# Patient Record
Sex: Female | Born: 1965
Health system: Southern US, Community
[De-identification: ages and names within clinical notes are randomized; demographics above are authoritative.]

## PROBLEM LIST (undated history)

## (undated) DIAGNOSIS — N840 Polyp of corpus uteri: Secondary | ICD-10-CM

## (undated) DIAGNOSIS — E079 Disorder of thyroid, unspecified: Secondary | ICD-10-CM

## (undated) DIAGNOSIS — D649 Anemia, unspecified: Secondary | ICD-10-CM

## (undated) DIAGNOSIS — E7800 Pure hypercholesterolemia, unspecified: Secondary | ICD-10-CM

## (undated) DIAGNOSIS — K219 Gastro-esophageal reflux disease without esophagitis: Secondary | ICD-10-CM

## (undated) DIAGNOSIS — G4730 Sleep apnea, unspecified: Secondary | ICD-10-CM

## (undated) DIAGNOSIS — F419 Anxiety disorder, unspecified: Secondary | ICD-10-CM

## (undated) DIAGNOSIS — F32A Depression, unspecified: Secondary | ICD-10-CM

## (undated) DIAGNOSIS — F429 Obsessive-compulsive disorder, unspecified: Secondary | ICD-10-CM

## (undated) DIAGNOSIS — I10 Essential (primary) hypertension: Secondary | ICD-10-CM

## (undated) DIAGNOSIS — D219 Benign neoplasm of connective and other soft tissue, unspecified: Secondary | ICD-10-CM

## (undated) DIAGNOSIS — E039 Hypothyroidism, unspecified: Secondary | ICD-10-CM

## (undated) DIAGNOSIS — C801 Malignant (primary) neoplasm, unspecified: Secondary | ICD-10-CM

## (undated) DIAGNOSIS — F329 Major depressive disorder, single episode, unspecified: Secondary | ICD-10-CM

## (undated) HISTORY — PX: DILATION AND CURETTAGE OF UTERUS: SHX78

## (undated) HISTORY — DX: Depression, unspecified: F32.A

## (undated) HISTORY — PX: DIAGNOSTIC LAPAROSCOPY: SUR761

## (undated) HISTORY — PX: WISDOM TOOTH EXTRACTION: SHX21

## (undated) HISTORY — DX: Benign neoplasm of connective and other soft tissue, unspecified: D21.9

## (undated) HISTORY — DX: Major depressive disorder, single episode, unspecified: F32.9

## (undated) HISTORY — PX: ABLATION: SHX5711

## (undated) HISTORY — DX: Polyp of corpus uteri: N84.0

---

## 2000-09-18 ENCOUNTER — Emergency Department (HOSPITAL_COMMUNITY): Admission: EM | Admit: 2000-09-18 | Discharge: 2000-09-18 | Payer: Self-pay | Admitting: Emergency Medicine

## 2001-06-01 ENCOUNTER — Ambulatory Visit (HOSPITAL_COMMUNITY): Admission: RE | Admit: 2001-06-01 | Discharge: 2001-06-01 | Payer: Self-pay | Admitting: Radiation Oncology

## 2001-06-01 ENCOUNTER — Encounter: Payer: Self-pay | Admitting: Family Medicine

## 2003-12-02 ENCOUNTER — Encounter: Admission: RE | Admit: 2003-12-02 | Discharge: 2003-12-02 | Payer: Self-pay | Admitting: Obstetrics and Gynecology

## 2004-05-18 ENCOUNTER — Emergency Department (HOSPITAL_COMMUNITY): Admission: EM | Admit: 2004-05-18 | Discharge: 2004-05-18 | Payer: Self-pay | Admitting: Emergency Medicine

## 2004-08-16 ENCOUNTER — Other Ambulatory Visit: Admission: RE | Admit: 2004-08-16 | Discharge: 2004-08-16 | Payer: Self-pay | Admitting: Family Medicine

## 2004-08-18 ENCOUNTER — Ambulatory Visit (HOSPITAL_COMMUNITY): Admission: RE | Admit: 2004-08-18 | Discharge: 2004-08-18 | Payer: Self-pay | Admitting: Family Medicine

## 2005-07-10 HISTORY — PX: MELANOMA EXCISION: SHX5266

## 2005-08-21 ENCOUNTER — Ambulatory Visit: Payer: Self-pay | Admitting: Psychiatry

## 2005-08-22 ENCOUNTER — Other Ambulatory Visit (HOSPITAL_COMMUNITY): Admission: RE | Admit: 2005-08-22 | Discharge: 2005-09-04 | Payer: Self-pay | Admitting: Psychiatry

## 2005-09-13 ENCOUNTER — Ambulatory Visit: Payer: Self-pay | Admitting: Licensed Clinical Social Worker

## 2005-09-19 ENCOUNTER — Encounter: Admission: RE | Admit: 2005-09-19 | Discharge: 2005-09-19 | Payer: Self-pay | Admitting: Obstetrics and Gynecology

## 2005-09-20 ENCOUNTER — Ambulatory Visit: Payer: Self-pay | Admitting: Licensed Clinical Social Worker

## 2005-09-25 ENCOUNTER — Ambulatory Visit: Payer: Self-pay | Admitting: Licensed Clinical Social Worker

## 2005-10-03 ENCOUNTER — Ambulatory Visit: Payer: Self-pay | Admitting: Licensed Clinical Social Worker

## 2005-10-10 ENCOUNTER — Ambulatory Visit: Payer: Self-pay | Admitting: Licensed Clinical Social Worker

## 2005-10-17 ENCOUNTER — Ambulatory Visit: Payer: Self-pay | Admitting: Licensed Clinical Social Worker

## 2005-10-26 ENCOUNTER — Ambulatory Visit: Payer: Self-pay | Admitting: Licensed Clinical Social Worker

## 2005-10-31 ENCOUNTER — Ambulatory Visit: Payer: Self-pay | Admitting: Licensed Clinical Social Worker

## 2005-11-07 ENCOUNTER — Ambulatory Visit: Payer: Self-pay | Admitting: Licensed Clinical Social Worker

## 2005-11-14 ENCOUNTER — Ambulatory Visit: Payer: Self-pay | Admitting: Licensed Clinical Social Worker

## 2005-12-12 ENCOUNTER — Ambulatory Visit: Payer: Self-pay | Admitting: Licensed Clinical Social Worker

## 2005-12-20 ENCOUNTER — Ambulatory Visit: Payer: Self-pay | Admitting: Licensed Clinical Social Worker

## 2006-01-03 ENCOUNTER — Ambulatory Visit: Payer: Self-pay | Admitting: Licensed Clinical Social Worker

## 2006-01-07 DIAGNOSIS — C801 Malignant (primary) neoplasm, unspecified: Secondary | ICD-10-CM

## 2006-01-07 HISTORY — DX: Malignant (primary) neoplasm, unspecified: C80.1

## 2006-01-09 ENCOUNTER — Encounter (INDEPENDENT_AMBULATORY_CARE_PROVIDER_SITE_OTHER): Payer: Self-pay | Admitting: Specialist

## 2006-01-09 ENCOUNTER — Ambulatory Visit (HOSPITAL_COMMUNITY): Admission: RE | Admit: 2006-01-09 | Discharge: 2006-01-09 | Payer: Self-pay | Admitting: *Deleted

## 2006-01-31 ENCOUNTER — Encounter: Admission: RE | Admit: 2006-01-31 | Discharge: 2006-01-31 | Payer: Self-pay | Admitting: Obstetrics and Gynecology

## 2006-03-01 ENCOUNTER — Ambulatory Visit: Payer: Self-pay | Admitting: Oncology

## 2006-03-27 ENCOUNTER — Ambulatory Visit (HOSPITAL_COMMUNITY): Admission: RE | Admit: 2006-03-27 | Discharge: 2006-03-27 | Payer: Self-pay | Admitting: Dermatology

## 2007-05-21 ENCOUNTER — Encounter: Admission: RE | Admit: 2007-05-21 | Discharge: 2007-05-21 | Payer: Self-pay | Admitting: Neurology

## 2007-11-25 ENCOUNTER — Other Ambulatory Visit: Admission: RE | Admit: 2007-11-25 | Discharge: 2007-11-25 | Payer: Self-pay | Admitting: Obstetrics and Gynecology

## 2007-12-06 ENCOUNTER — Encounter: Admission: RE | Admit: 2007-12-06 | Discharge: 2007-12-06 | Payer: Self-pay | Admitting: Obstetrics and Gynecology

## 2008-01-15 ENCOUNTER — Emergency Department (HOSPITAL_COMMUNITY): Admission: EM | Admit: 2008-01-15 | Discharge: 2008-01-15 | Payer: Self-pay | Admitting: Emergency Medicine

## 2010-02-09 ENCOUNTER — Ambulatory Visit: Payer: Self-pay | Admitting: Hematology and Oncology

## 2010-02-10 LAB — CBC & DIFF AND RETIC
Immature Retic Fract: 17.8 % — ABNORMAL HIGH (ref 0.00–10.70)
MCH: 22 pg — ABNORMAL LOW (ref 25.1–34.0)
MCHC: 30.3 g/dL — ABNORMAL LOW (ref 31.5–36.0)
MONO#: 0.5 10*3/uL (ref 0.1–0.9)
NEUT%: 59.5 % (ref 38.4–76.8)
Platelets: 354 10*3/uL (ref 145–400)
RBC: 4.78 10*6/uL (ref 3.70–5.45)
Retic %: 1.84 % — ABNORMAL HIGH (ref 0.50–1.50)
Retic Ct Abs: 87.95 10*3/uL — ABNORMAL HIGH (ref 18.30–72.70)
nRBC: 0 % (ref 0–0)

## 2010-02-10 LAB — URINALYSIS, MICROSCOPIC - CHCC
Bilirubin (Urine): NEGATIVE
Leukocyte Esterase: NEGATIVE
Nitrite: NEGATIVE
Protein: NEGATIVE mg/dL
Specific Gravity, Urine: 1.015 (ref 1.003–1.035)
pH: 5 (ref 4.6–8.0)

## 2010-02-10 LAB — MORPHOLOGY

## 2010-02-15 LAB — COMPREHENSIVE METABOLIC PANEL
AST: 17 U/L (ref 0–37)
Albumin: 4.1 g/dL (ref 3.5–5.2)
Alkaline Phosphatase: 62 U/L (ref 39–117)
CO2: 26 mEq/L (ref 19–32)
Chloride: 102 mEq/L (ref 96–112)
Creatinine, Ser: 0.78 mg/dL (ref 0.40–1.20)
Glucose, Bld: 95 mg/dL (ref 70–99)
Potassium: 3.7 mEq/L (ref 3.5–5.3)
Sodium: 137 mEq/L (ref 135–145)
Total Bilirubin: 0.2 mg/dL — ABNORMAL LOW (ref 0.3–1.2)
Total Protein: 7.2 g/dL (ref 6.0–8.3)

## 2010-02-15 LAB — PROTEIN ELECTROPHORESIS, SERUM
Alpha-1-Globulin: 7.2 % — ABNORMAL HIGH (ref 2.9–4.9)
Alpha-2-Globulin: 9.7 % (ref 7.1–11.8)
Beta 2: 4 % (ref 3.2–6.5)
Beta Globulin: 7.1 % (ref 4.7–7.2)

## 2010-02-15 LAB — VON WILLEBRAND PANEL
Factor-VIII Activity: 114 % (ref 50–150)
Ristocetin-Cofactor: 134 % (ref 50–150)
Von Willebrand Ag: 123 % normal (ref 61–164)

## 2010-02-15 LAB — DIRECT ANTIGLOBULIN TEST (NOT AT ARMC): DAT IgG: NEGATIVE

## 2010-02-15 LAB — IRON AND TIBC: %SAT: 7 % — ABNORMAL LOW (ref 20–55)

## 2010-02-15 LAB — HAPTOGLOBIN: Haptoglobin: 150 mg/dL (ref 16–200)

## 2010-03-28 ENCOUNTER — Ambulatory Visit: Payer: Self-pay | Admitting: Hematology and Oncology

## 2010-03-30 LAB — CBC WITH DIFFERENTIAL/PLATELET
BASO%: 0.3 % (ref 0.0–2.0)
EOS%: 2.5 % (ref 0.0–7.0)
HGB: 13.8 g/dL (ref 11.6–15.9)
MCHC: 33.7 g/dL (ref 31.5–36.0)
MONO%: 5.7 % (ref 0.0–14.0)
Platelets: 269 10*3/uL (ref 145–400)
RBC: 5.09 10*6/uL (ref 3.70–5.45)
RDW: 26.1 % — ABNORMAL HIGH (ref 11.2–14.5)
WBC: 8.5 10*3/uL (ref 3.9–10.3)
lymph#: 1.8 10*3/uL (ref 0.9–3.3)

## 2010-03-30 LAB — BASIC METABOLIC PANEL
BUN: 10 mg/dL (ref 6–23)
CO2: 26 mEq/L (ref 19–32)
Calcium: 9.6 mg/dL (ref 8.4–10.5)
Creatinine, Ser: 0.79 mg/dL (ref 0.40–1.20)
Sodium: 137 mEq/L (ref 135–145)

## 2010-05-23 ENCOUNTER — Emergency Department (HOSPITAL_COMMUNITY): Admission: EM | Admit: 2010-05-23 | Discharge: 2010-05-23 | Payer: Self-pay | Admitting: Emergency Medicine

## 2010-05-28 ENCOUNTER — Emergency Department (HOSPITAL_COMMUNITY): Admission: EM | Admit: 2010-05-28 | Discharge: 2010-05-29 | Payer: Self-pay | Admitting: Emergency Medicine

## 2010-06-01 ENCOUNTER — Other Ambulatory Visit (HOSPITAL_COMMUNITY)
Admission: RE | Admit: 2010-06-01 | Discharge: 2010-06-08 | Payer: Self-pay | Source: Home / Self Care | Admitting: Psychiatry

## 2010-06-10 ENCOUNTER — Ambulatory Visit: Payer: Self-pay | Admitting: Psychiatry

## 2010-07-22 ENCOUNTER — Ambulatory Visit (HOSPITAL_COMMUNITY)
Admission: RE | Admit: 2010-07-22 | Discharge: 2010-07-22 | Payer: Self-pay | Source: Home / Self Care | Attending: Psychiatry | Admitting: Psychiatry

## 2010-09-20 LAB — BASIC METABOLIC PANEL
BUN: 5 mg/dL — ABNORMAL LOW (ref 6–23)
Calcium: 8.9 mg/dL (ref 8.4–10.5)
Creatinine, Ser: 0.7 mg/dL (ref 0.4–1.2)
GFR calc non Af Amer: >60 mL/min (ref >60)

## 2010-09-20 LAB — CBC
HCT: 37.4 % (ref 36.0–46.0)
MCH: 28.1 pg (ref 26.0–34.0)
RBC: 4.48 MIL/uL (ref 3.87–5.11)
WBC: 7.5 10*3/uL (ref 4.0–10.5)

## 2010-09-20 LAB — DIFFERENTIAL
Basophils Absolute: 0 10*3/uL (ref 0.0–0.1)
Eosinophils Relative: 2 % (ref 0–5)
Lymphocytes Relative: 25 % (ref 12–46)
Monocytes Relative: 8 % (ref 3–12)
Neutrophils Relative %: 66 % (ref 43–77)

## 2010-09-20 LAB — HCG, QUANTITATIVE, PREGNANCY: hCG, Beta Chain, Quant, S: <2 m[IU]/mL (ref <5)

## 2010-09-20 LAB — WET PREP, GENITAL: Trich, Wet Prep: NONE SEEN

## 2010-09-20 LAB — GC/CHLAMYDIA PROBE AMP, GENITAL
Chlamydia, DNA Probe: NEGATIVE
GC Probe Amp, Genital: NEGATIVE

## 2010-09-29 ENCOUNTER — Other Ambulatory Visit: Payer: Self-pay | Admitting: Dermatology

## 2010-09-29 ENCOUNTER — Ambulatory Visit (HOSPITAL_COMMUNITY)
Admission: RE | Admit: 2010-09-29 | Discharge: 2010-09-29 | Disposition: A | Discharge status: Home / Self Care | Payer: 59 | Source: Ambulatory Visit | Attending: Dermatology | Admitting: Dermatology

## 2010-09-29 ENCOUNTER — Other Ambulatory Visit (HOSPITAL_COMMUNITY): Payer: Self-pay | Admitting: Dermatology

## 2010-09-29 DIAGNOSIS — R0602 Shortness of breath: Secondary | ICD-10-CM | POA: Insufficient documentation

## 2010-09-29 DIAGNOSIS — F17200 Nicotine dependence, unspecified, uncomplicated: Secondary | ICD-10-CM | POA: Insufficient documentation

## 2010-09-29 DIAGNOSIS — R059 Cough, unspecified: Secondary | ICD-10-CM | POA: Insufficient documentation

## 2010-09-29 DIAGNOSIS — Z85828 Personal history of other malignant neoplasm of skin: Secondary | ICD-10-CM | POA: Insufficient documentation

## 2010-09-29 DIAGNOSIS — I10 Essential (primary) hypertension: Secondary | ICD-10-CM | POA: Insufficient documentation

## 2010-09-29 DIAGNOSIS — R05 Cough: Secondary | ICD-10-CM | POA: Insufficient documentation

## 2010-11-25 NOTE — Op Note (Signed)
Adrienne Freeman, Adrienne Freeman                ACCOUNT NO.:  [id number]   MEDICAL RECORD NO.:  [id number]          PATIENT TYPE:  AMB   LOCATION:  SDS                          FACILITY:  [facility]   PHYSICIAN:  Alfonse Ras, MD   DATE OF BIRTH:  07-05-1966   DATE OF PROCEDURE:  01/09/2006  DATE OF DISCHARGE:  01/09/2006                                 OPERATIVE REPORT   NOTE:  This is a repeat dictation.   PREOPERATIVE DIAGNOSIS:  Malignant melanoma of the left lower extremity.   POSTOPERATIVE DIAGNOSIS:  Malignant melanoma of the left lower extremity.   PROCEDURE:  Wide excision of left lower extremity melanoma.   DESCRIPTION:  Patient was taken to the operating room and placed in a supine  position.  After adequate anesthesia was induced using   DICTATION ENDED AT THIS POINT.      Alfonse Ras, MD  Electronically Signed     KRE/MEDQ  D:  04/16/2006  T:  04/17/2006  Job:  [id number]

## 2010-11-25 NOTE — Op Note (Signed)
Adrienne Freeman, Adrienne Freeman                ACCOUNT NO.:  [id number]   MEDICAL RECORD NO.:  [id number]          PATIENT TYPE:  AMB   LOCATION:  SDS                          FACILITY:  [facility]   PHYSICIAN:  Alfonse Ras, MD   DATE OF BIRTH:  1965-11-26   DATE OF PROCEDURE:  01/09/2006  DATE OF DISCHARGE:                                 OPERATIVE REPORT   PREOPERATIVE DIAGNOSIS:  Left lower extremity malignant melanoma.   POSTOPERATIVE DIAGNOSIS:  Left lower extremity malignant melanoma.   PROCEDURE:  Wide excision of left extremity melanoma.   ANESTHESIA:  Local MAC.   SURGEON:  Baruch Merl, M.D.   DESCRIPTION:  The patient was taken to operating room and placed in the  right lateral decubitus position.  After adequate MAC anesthesia was  induced, the left posterior leg was prepped and draped in normal sterile  fashion.  Using 1% lidocaine with bicarb as local anesthesia, skin was  anesthetized.  An elliptical incision was made around the previous puncture  biopsy site out to about 9 to 10 mm in all directions.  This was taken down  to the subcutaneous fat and excised in its entirety.  Distal end was marked  with a nylon suture.  The small flaps were created in both directions until  primary closure could be accomplished with vertical mattress 3-0 nylon  sutures.  Bacitracin ointment was placed.  The patient tolerated the  procedure well with PACU in good condition.      Alfonse Ras, MD  Electronically Signed     KRE/MEDQ  D:  01/09/2006  T:  01/09/2006  Job:  [id number]

## 2011-01-30 HISTORY — PX: DOPPLER ECHOCARDIOGRAPHY: SHX263

## 2011-06-22 ENCOUNTER — Inpatient Hospital Stay (HOSPITAL_COMMUNITY)
Admission: AD | Admit: 2011-06-22 | Discharge: 2011-06-23 | Disposition: A | Discharge status: Home / Self Care | Payer: BC Managed Care – PPO | Source: Ambulatory Visit | Attending: Obstetrics and Gynecology | Admitting: Obstetrics and Gynecology

## 2011-06-22 ENCOUNTER — Encounter (HOSPITAL_COMMUNITY): Payer: Self-pay | Admitting: *Deleted

## 2011-06-22 DIAGNOSIS — D649 Anemia, unspecified: Secondary | ICD-10-CM | POA: Insufficient documentation

## 2011-06-22 DIAGNOSIS — N920 Excessive and frequent menstruation with regular cycle: Secondary | ICD-10-CM | POA: Insufficient documentation

## 2011-06-22 DIAGNOSIS — D259 Leiomyoma of uterus, unspecified: Secondary | ICD-10-CM | POA: Insufficient documentation

## 2011-06-22 DIAGNOSIS — D219 Benign neoplasm of connective and other soft tissue, unspecified: Secondary | ICD-10-CM

## 2011-06-22 HISTORY — DX: Disorder of thyroid, unspecified: E07.9

## 2011-06-22 HISTORY — DX: Essential (primary) hypertension: I10

## 2011-06-22 HISTORY — DX: Anemia, unspecified: D64.9

## 2011-06-22 LAB — CBC
HCT: 30.9 % — ABNORMAL LOW (ref 36.0–46.0)
MCHC: 30.7 g/dL (ref 30.0–36.0)
MCV: 73 fL — ABNORMAL LOW (ref 78.0–100.0)
RDW: 19.2 % — ABNORMAL HIGH (ref 11.5–15.5)

## 2011-06-22 LAB — TYPE AND SCREEN
ABO/RH(D): A POS
Antibody Screen: NEGATIVE

## 2011-06-22 NOTE — Progress Notes (Signed)
Pt states she has been having heavy bleeding since last night.

## 2011-06-22 NOTE — Progress Notes (Signed)
PT SAYS CYCLE STARTED ON WED. Marland Kitchen   SAYS HAS IRON DR- GAVE IRON  IN 2011.  LABS DONE IN OFFICE - DR WHITE-  ON 06-09-2011-    TAKES NEW IRON.     SAYS CYCLE WAS HEAVY   WED NIGHT.- TODAY- 2X HEAVY - SINCE HERE IS LESS.   PAD ON  IN TRIAGE-SMALL AMT

## 2011-06-23 MED ORDER — TRANEXAMIC ACID 650 MG PO TABS: 1300.0000 mg | ORAL_TABLET | Freq: Three times a day (TID) | ORAL | Status: DC

## 2011-06-23 NOTE — ED Provider Notes (Signed)
History   Adrienne Freeman is a 45 y.o. year old G20P3003 female who presents to [facility]U reporting heavy menstrual bleeding since yesterday, slightly lighter today. She denies dizziness. She was referred to [facility]U by her Dr.'s office due to Hx of severe anemia. She has been Dx w/ fibroids and has had an ablation that did not work.    CSN: [id number] Arrival date & time: 06/22/2011  6:26 PM   None     No chief complaint on file.   (Consider location/radiation/quality/duration/timing/severity/associated sxs/prior treatment) HPI  Past Medical History  Diagnosis Date  . Hypertension   . Anemia   . Thyroid disorder     Past Surgical History  Procedure Date  . Albllation     Family History  Problem Relation Age of Onset  . Hypertension Mother   . Heart disease Father   . Hypertension Father     History  Substance Use Topics  . Smoking status: Former Games developer  . Smokeless tobacco: Not on file  . Alcohol Use: No    OB History    Grav Para Term Preterm Abortions TAB SAB Ect Mult Living   3 3 3       3       Review of Systems: Otherwise neg  Allergies  Lexapro; Toprol xl; Altace; Codeine; Effexor; Paxil; and Zoloft  Home Medications  No current outpatient prescriptions on file.  BP 142/87  Pulse 74  Temp(Src) 98.3 F (36.8 C) (Oral)  Resp 18  Ht 5' (1.524 m)  Wt 99.848 kg (220 lb 2 oz)  BMI 42.99 kg/m2  LMP 06/21/2011  Physical Exam  Constitutional: She appears well-developed.  A&Ox4, anxious Mod amount of DRB in vault. No CMT, polyps or adnexal masses.  ED Course  Procedures (including critical care time)  Recent Results (from the past 336 hour(s))  CBC   Collection Time   06/22/11 10:45 PM      Component Value Range   WBC 10.4  4.0 - 10.5 (K/uL)   RBC 4.23  3.87 - 5.11 (MIL/uL)   Hemoglobin 9.5 (*) 12.0 - 15.0 (g/dL)   HCT 40.9 (*) 81.1 - 46.0 (%)   MCV 73.0 (*) 78.0 - 100.0 (fL)   MCH 22.5 (*) 26.0 - 34.0 (pg)   MCHC 30.7  30.0 - 36.0 (g/dL)   RDW  91.4 (*) 78.2 - 15.5 (%)   Platelets 340  150 - 400 (K/uL)  TYPE AND SCREEN   Collection Time   06/22/11 10:45 PM      Component Value Range   ABO/RH(D) A POS     Antibody Screen NEG     Sample Expiration 06/25/2011    ABO/RH   Collection Time   06/22/11 10:45 PM      Component Value Range   ABO/RH(D) A POS      MDM  Menorrhagia due to fibroids. Mild anemia, hemodynamically stable  D/C home per consult w/ Dr. Gunnar Bulla. Rx Lysteda F/U w/ Gynecologist next week or MAU as needed for heavy bleeding  Adrienne Freeman 06/23/11 12:35 AM

## 2011-06-30 ENCOUNTER — Ambulatory Visit (HOSPITAL_COMMUNITY)
Admission: RE | Admit: 2011-06-30 | Discharge: 2011-06-30 | Disposition: A | Discharge status: Home / Self Care | Payer: BC Managed Care – PPO | Source: Ambulatory Visit | Attending: Psychiatry | Admitting: Psychiatry

## 2011-06-30 ENCOUNTER — Encounter (HOSPITAL_COMMUNITY): Payer: Self-pay | Admitting: *Deleted

## 2011-06-30 DIAGNOSIS — F411 Generalized anxiety disorder: Secondary | ICD-10-CM | POA: Insufficient documentation

## 2011-06-30 DIAGNOSIS — F331 Major depressive disorder, recurrent, moderate: Secondary | ICD-10-CM | POA: Insufficient documentation

## 2011-06-30 HISTORY — DX: Pure hypercholesterolemia, unspecified: E78.00

## 2011-06-30 HISTORY — DX: Gastro-esophageal reflux disease without esophagitis: K21.9

## 2011-06-30 HISTORY — DX: Hypothyroidism, unspecified: E03.9

## 2011-06-30 HISTORY — DX: Sleep apnea, unspecified: G47.30
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
## 2011-07-13 ENCOUNTER — Other Ambulatory Visit: Payer: Self-pay | Admitting: Nurse Practitioner

## 2011-07-13 ENCOUNTER — Telehealth: Payer: Self-pay | Admitting: Hematology and Oncology

## 2011-07-13 DIAGNOSIS — D649 Anemia, unspecified: Secondary | ICD-10-CM

## 2011-07-13 NOTE — Telephone Encounter (Signed)
lmonvm for pt re appts for 1/14 and 1/15

## 2011-07-17 ENCOUNTER — Other Ambulatory Visit: Payer: Self-pay | Admitting: Hematology and Oncology

## 2011-07-17 ENCOUNTER — Telehealth: Payer: Self-pay | Admitting: Hematology and Oncology

## 2011-07-17 DIAGNOSIS — C189 Malignant neoplasm of colon, unspecified: Secondary | ICD-10-CM

## 2011-07-17 NOTE — Telephone Encounter (Signed)
l/m that her appt was moved to 1/11 at 1:00 per dr Ria Clock

## 2011-07-18 ENCOUNTER — Telehealth: Payer: Self-pay | Admitting: *Deleted

## 2011-07-18 NOTE — Telephone Encounter (Signed)
Opened in error

## 2011-07-19 ENCOUNTER — Encounter: Payer: Self-pay | Admitting: *Deleted

## 2011-07-21 ENCOUNTER — Other Ambulatory Visit: Payer: Self-pay | Admitting: Hematology and Oncology

## 2011-07-21 ENCOUNTER — Other Ambulatory Visit (HOSPITAL_BASED_OUTPATIENT_CLINIC_OR_DEPARTMENT_OTHER): Payer: BC Managed Care – PPO

## 2011-07-21 ENCOUNTER — Telehealth: Payer: Self-pay | Admitting: Hematology and Oncology

## 2011-07-21 ENCOUNTER — Ambulatory Visit (HOSPITAL_BASED_OUTPATIENT_CLINIC_OR_DEPARTMENT_OTHER): Payer: BC Managed Care – PPO | Admitting: Hematology and Oncology

## 2011-07-21 VITALS — BP 134/70 | HR 82 | Temp 98.0°F | Ht 60.0 in | Wt 222.3 lb

## 2011-07-21 DIAGNOSIS — D539 Nutritional anemia, unspecified: Secondary | ICD-10-CM

## 2011-07-21 DIAGNOSIS — D649 Anemia, unspecified: Secondary | ICD-10-CM

## 2011-07-21 DIAGNOSIS — C189 Malignant neoplasm of colon, unspecified: Secondary | ICD-10-CM

## 2011-07-21 DIAGNOSIS — D509 Iron deficiency anemia, unspecified: Secondary | ICD-10-CM

## 2011-07-21 LAB — CBC WITH DIFFERENTIAL/PLATELET
Basophils Absolute: 0 10*3/uL (ref 0.0–0.1)
Eosinophils Absolute: 0.2 10*3/uL (ref 0.0–0.5)
HCT: 30.7 % — ABNORMAL LOW (ref 34.8–46.6)
HGB: 10 g/dL — ABNORMAL LOW (ref 11.6–15.9)
MONO#: 0.5 10*3/uL (ref 0.1–0.9)
NEUT%: 60 % (ref 38.4–76.8)
WBC: 7.4 10*3/uL (ref 3.9–10.3)
lymph#: 2.3 10*3/uL (ref 0.9–3.3)

## 2011-07-21 NOTE — Progress Notes (Signed)
Addended by: Normajean Baxter LE on: 07/21/2011 05:49 PM   Modules accepted: Orders

## 2011-07-21 NOTE — Progress Notes (Signed)
Dr.      Esmond Harps      -       Primary   @  [place] on  [address]      Fermin Schwab      -     Surgeon  @  [facility]   Dept  OB/GYN.   Cell    Phone       [phone].

## 2011-07-21 NOTE — Progress Notes (Signed)
This office note has been dictated.

## 2011-07-21 NOTE — Telephone Encounter (Signed)
had printed 2/14 and 2/19 appts for pt while in the office but called pt wiht 1/15 iron tx time,aware      aom

## 2011-07-22 NOTE — Progress Notes (Signed)
CC:   Adrienne Freeman. Adrienne Freeman, M.D.  IDENTIFYING PATIENT:  Patient is a 46 year old woman with anemia who presents for followup.  INTERVAL HISTORY:  The patient was last seen in July 2011.  In August 2011, she had presented with iron-deficiency anemia secondary to menorrhagia.  She is known to have uterine fibroids.  She has had an evaluation in the past with Dr. Candice Camp and had undergone ablation for uterine polyps.  The patient reports that after this procedure, she noted recurrence of her heavy menses.  She is currently on danazol which helps some.  She saw her gynecologist last month, and he had obtained iron studies that had noted a ferritin level of 8.  Hemoglobin and hematocrit at the time were 10.5 and 33.5, respectively.  Adrienne Freeman has received IV iron at this on August 2011, which she tolerated well with good results.  She also was extensively evaluated, and there was no evidence of hemolysis.  Her von Willebrand panel at that time was unremarkable.  MEDICATIONS:  Reviewed and updated.  ALLERGIES:  [place]per[place], Fortaz, Altace, Toprol, Lexapro, Zoloft, Effexor, Codeine, and Paxil.  SOCIAL HISTORY:  She is married with 3 children.  She denies alcohol or tobacco use.  She works as a Comptroller 2 days a week.  PAST MEDICAL HISTORY: 1. Status post excision of T1a melanoma from her calf in 2007. 2. History of uterine fibroids with menorrhagia. 3. Hypertension. 4. History of chronic lymphocytic thyroiditis. 5. Hypothyroidism. 6. Iron-deficiency anemia. 7. Esophageal reflux.  FAMILY HISTORY:  Negative for oncologic or hematologic malignancies.  REVIEW OF SYSTEMS:  Denies fatigue, shortness of breath, or cough. Denies fever chills, night sweats, or anorexia.  Cardiovascular: Denies chest pain, PND, orthopnea, or ankle swelling.  Respirations: Denies cough, hemoptysis, wheeze, or shortness of breath.  GI: Denies nausea, vomiting, abdominal pain, diarrhea, melena, or hematochezia.  GU:  Denies dysuria, hematuria, nocturia, or frequency.  Skin: No bruising or bleeding.  Neurologic: Denies headache, vision changes, or extremity weakness.  PHYSICAL EXAM:  General: The patient is a well-appearing, well- nourished, man in no distress.  Vitals: Pulse 82, blood pressure 134/70, temperature 98, and respirations 20.  HEENT: Head is atraumatic, normocephalic.  Sclerae anicteric.  Mouth moist.  Chest:  Clear.  CVS: Unremarkable.  Abdomen:  Soft, nontender.  No palpable masses.  Bowel sounds present.  Extremities: No calf tenderness.  Pulses present symmetrical.  Lymph nodes: No adenopathy.  CNS: Nonfocal.  LAB DATA:  CBC 07/21/2011:  White cell count 7.4, hemoglobin 10, hematocrit 30.7, and platelets 387.  Ferritin pending.  IMPRESSION AND PLAN:  Ms. Freeman is a 45 year old woman with a known history of iron-deficiency anemia secondary to menorrhagia.  She received IV iron in the form of INFeD in August 2011.  She is now significantly iron deficient.  She has agreed to proceed with iron.  She plans to follow up with a gynecologist from [facility] to be considered for a myomectomy.  She has been scheduled to follow up in 4-6 weeks' time.    ______________________________ Adrienne Freeman, M.D. LIO/MEDQ  D:  07/21/2011  T:  07/22/2011  Job:  [id number]

## 2011-07-24 ENCOUNTER — Other Ambulatory Visit: Payer: Self-pay | Admitting: Lab

## 2011-07-25 ENCOUNTER — Ambulatory Visit (HOSPITAL_BASED_OUTPATIENT_CLINIC_OR_DEPARTMENT_OTHER): Payer: BC Managed Care – PPO

## 2011-07-25 ENCOUNTER — Ambulatory Visit: Payer: Self-pay | Admitting: Hematology and Oncology

## 2011-07-25 VITALS — BP 131/84 | HR 62 | Temp 97.7°F

## 2011-07-25 DIAGNOSIS — N920 Excessive and frequent menstruation with regular cycle: Secondary | ICD-10-CM

## 2011-07-25 DIAGNOSIS — D539 Nutritional anemia, unspecified: Secondary | ICD-10-CM

## 2011-07-25 DIAGNOSIS — D500 Iron deficiency anemia secondary to blood loss (chronic): Secondary | ICD-10-CM

## 2011-07-25 LAB — COMPREHENSIVE METABOLIC PANEL
ALT: 24 U/L (ref 0–35)
BUN: 5 mg/dL — ABNORMAL LOW (ref 6–23)
CO2: 27 mEq/L (ref 19–32)
Calcium: 8.8 mg/dL (ref 8.4–10.5)
Chloride: 104 mEq/L (ref 96–112)
Creatinine, Ser: 0.78 mg/dL (ref 0.50–1.10)

## 2011-07-25 LAB — TRANSFERRIN RECEPTOR, SOLUABLE: Transferrin Receptor, Soluble: 2.11 mg/L — ABNORMAL HIGH (ref 0.76–1.76)

## 2011-07-25 MED ORDER — SODIUM CHLORIDE 0.9 % IV SOLN
Freq: Once | INTRAVENOUS | Status: AC
  Administered 2011-07-25: 10:00:00 via INTRAVENOUS

## 2011-07-25 MED ORDER — ACETAMINOPHEN 325 MG PO TABS
650.0000 mg | ORAL_TABLET | Freq: Once | ORAL | Status: AC
  Administered 2011-07-25: 650 mg via ORAL

## 2011-07-25 MED ORDER — DIPHENHYDRAMINE HCL 25 MG PO TABS
25.0000 mg | ORAL_TABLET | Freq: Once | ORAL | Status: AC
  Administered 2011-07-25: 25 mg via ORAL
  Filled 2011-07-25: qty 1

## 2011-07-25 MED ORDER — SODIUM CHLORIDE 0.9 % IV SOLN
25.0000 mg | Freq: Once | INTRAVENOUS | Status: AC
  Administered 2011-07-25: 25 mg via INTRAVENOUS
  Filled 2011-07-25: qty 0.5

## 2011-07-25 MED ORDER — SODIUM CHLORIDE 0.9 % IV SOLN
1723.0000 mg | Freq: Once | INTRAVENOUS | Status: AC
  Administered 2011-07-25: 1723 mg via INTRAVENOUS
  Filled 2011-07-25: qty 34.46

## 2011-07-25 NOTE — Progress Notes (Signed)
1345-Pt tolerating Infed without difficulties.  VS stable.  Rate increased to 118 ml/hr.  1445-Pt continues to tolerate Infed without difficulty or complaints.  VS stable.  Rate increased to 140 ml/hr.

## 2011-07-26 ENCOUNTER — Other Ambulatory Visit: Payer: Self-pay | Admitting: Oncology

## 2011-08-22 ENCOUNTER — Telehealth: Payer: Self-pay | Admitting: *Deleted

## 2011-08-22 NOTE — Telephone Encounter (Signed)
Pt called to inform md that she will have myomectomy surgery on  08/29/11.     Pt also is scheduled to f/u with md on same day.    Pt would like to know if pt should come in to see Dr. Dalene Carrow  Sooner prior to surgery.    Pt stated she had labs drawn at her primary Dr. Cliffton Asters.   Pt would like to know of Dr. Lonell Face  Recommendations. Pt's   Phone   [phone].

## 2011-08-22 NOTE — Telephone Encounter (Signed)
Dr. Dalene Carrow reviewed lab results from Dr. Lucilla Lame office.   Spoke with pt and informed pt re:   Hgb and Ferritin levels adequate;  Move forward with myomectomy surgery  As per md's instructions.    Instructed pt to call office after surgery so pt can be rescheduled for f/u appt with Dr. Dalene Carrow.    Pt voiced understanding.

## 2011-08-24 ENCOUNTER — Other Ambulatory Visit: Payer: Self-pay | Admitting: Lab

## 2011-08-29 ENCOUNTER — Ambulatory Visit: Payer: Self-pay | Admitting: Hematology and Oncology

## 2011-12-19 ENCOUNTER — Emergency Department (HOSPITAL_COMMUNITY)
Admission: EM | Admit: 2011-12-19 | Discharge: 2011-12-19 | Disposition: A | Discharge status: Home / Self Care | Payer: BC Managed Care – PPO | Attending: Emergency Medicine | Admitting: Emergency Medicine

## 2011-12-19 ENCOUNTER — Encounter (HOSPITAL_COMMUNITY): Payer: Self-pay | Admitting: Emergency Medicine

## 2011-12-19 ENCOUNTER — Emergency Department (HOSPITAL_COMMUNITY): Payer: BC Managed Care – PPO

## 2011-12-19 DIAGNOSIS — G4730 Sleep apnea, unspecified: Secondary | ICD-10-CM | POA: Insufficient documentation

## 2011-12-19 DIAGNOSIS — I10 Essential (primary) hypertension: Secondary | ICD-10-CM | POA: Insufficient documentation

## 2011-12-19 DIAGNOSIS — F411 Generalized anxiety disorder: Secondary | ICD-10-CM | POA: Insufficient documentation

## 2011-12-19 DIAGNOSIS — F419 Anxiety disorder, unspecified: Secondary | ICD-10-CM

## 2011-12-19 DIAGNOSIS — E7800 Pure hypercholesterolemia, unspecified: Secondary | ICD-10-CM | POA: Insufficient documentation

## 2011-12-19 DIAGNOSIS — R079 Chest pain, unspecified: Secondary | ICD-10-CM

## 2011-12-19 DIAGNOSIS — D259 Leiomyoma of uterus, unspecified: Secondary | ICD-10-CM | POA: Insufficient documentation

## 2011-12-19 DIAGNOSIS — Z85828 Personal history of other malignant neoplasm of skin: Secondary | ICD-10-CM | POA: Insufficient documentation

## 2011-12-19 DIAGNOSIS — E039 Hypothyroidism, unspecified: Secondary | ICD-10-CM | POA: Insufficient documentation

## 2011-12-19 DIAGNOSIS — K219 Gastro-esophageal reflux disease without esophagitis: Secondary | ICD-10-CM | POA: Insufficient documentation

## 2011-12-19 DIAGNOSIS — E063 Autoimmune thyroiditis: Secondary | ICD-10-CM | POA: Insufficient documentation

## 2011-12-19 DIAGNOSIS — Z79899 Other long term (current) drug therapy: Secondary | ICD-10-CM | POA: Insufficient documentation

## 2011-12-19 DIAGNOSIS — R002 Palpitations: Secondary | ICD-10-CM | POA: Insufficient documentation

## 2011-12-19 HISTORY — DX: Anxiety disorder, unspecified: F41.9

## 2011-12-19 LAB — BASIC METABOLIC PANEL
BUN: 8 mg/dL (ref 6–23)
CO2: 27 mEq/L (ref 19–32)
Chloride: 99 mEq/L (ref 96–112)
GFR calc non Af Amer: >90 mL/min (ref >90)
Glucose, Bld: 124 mg/dL — ABNORMAL HIGH (ref 70–99)
Potassium: 3 mEq/L — ABNORMAL LOW (ref 3.5–5.1)
Sodium: 139 mEq/L (ref 135–145)

## 2011-12-19 LAB — DIFFERENTIAL
Eosinophils Absolute: 0.1 10*3/uL (ref 0.0–0.7)
Lymphocytes Relative: 15 % (ref 12–46)
Lymphs Abs: 1.6 10*3/uL (ref 0.7–4.0)
Monocytes Relative: 8 % (ref 3–12)
Neutro Abs: 8.3 10*3/uL — ABNORMAL HIGH (ref 1.7–7.7)
Neutrophils Relative %: 77 % (ref 43–77)

## 2011-12-19 LAB — CBC
Hemoglobin: 14.8 g/dL (ref 12.0–15.0)
MCH: 28.5 pg (ref 26.0–34.0)
Platelets: 287 10*3/uL (ref 150–400)
RBC: 5.2 MIL/uL — ABNORMAL HIGH (ref 3.87–5.11)
WBC: 10.8 10*3/uL — ABNORMAL HIGH (ref 4.0–10.5)

## 2011-12-19 LAB — POCT I-STAT TROPONIN I

## 2011-12-19 MED ORDER — POTASSIUM CHLORIDE CRYS ER 20 MEQ PO TBCR
40.0000 meq | EXTENDED_RELEASE_TABLET | Freq: Once | ORAL | Status: AC
  Administered 2011-12-19: 40 meq via ORAL
  Filled 2011-12-19: qty 2

## 2011-12-19 MED ORDER — LORAZEPAM 1 MG PO TABS
1.0000 mg | ORAL_TABLET | Freq: Once | ORAL | Status: DC
  Filled 2011-12-19: qty 1

## 2011-12-19 NOTE — ED Provider Notes (Signed)
History     CSN: [id number]  Arrival date & time 12/19/11  1745   First MD Initiated Contact with Patient 12/19/11 1801      Chief Complaint  Patient presents with  . Chest Pain    (Consider location/radiation/quality/duration/timing/severity/associated sxs/prior treatment) Patient is a 46 y.o. female presenting with chest pain. The history is provided by the patient and a relative.  Chest Pain The chest pain began yesterday. Chest pain occurs intermittently. The chest pain is unchanged. At its most intense, the pain is at 6/10. The pain is currently at 1/10. The severity of the pain is mild. The quality of the pain is described as pressure-like. The pain does not radiate. Primary symptoms include palpitations and dizziness. Pertinent negatives for primary symptoms include no fever, no shortness of breath, no cough, no wheezing, no abdominal pain, no nausea and no vomiting.  The palpitations also occurred with dizziness. The palpitations did not occur with shortness of breath.  Dizziness also occurs with weakness. Dizziness does not occur with nausea or vomiting.  Associated symptoms include weakness.   Pt with intermittent CP for two days. States non exertional, occurred yesterday and today, shortly after taking her ativan and celexa. States was recently discharged from [facility] where she was treated and trialed on different medications for her anxiety. States was finally d/c with increased dose of celexa from 10mg  to 20mg  and ativan. Pt states she has had this chest pressure since yesterday associated with "cloudy head" and tingling in her hands. Pt states her BP was elevated yesterday and again today at home at 200/100s. Pt states she went to fire dept to be checked today and was told to come here.  Pt denies prior cardiac problems. Pain is not constant, not pleuritic, no SOB, no swelling, no pain in extremities.  Past Medical History  Diagnosis Date  . Sleep apnea   . GERD  (gastroesophageal reflux disease)   . Hypercholesterolemia   . Anemia   . Fibroids     Uterine  . Hypertension   . Thyroid disorder     Chronic lymphocytic thyroiditis.  Marland Kitchen Hypothyroidism   . Anxiety     Past Surgical History  Procedure Date  . Albllation   . Melanoma excision 2007    T1a melanoma from calf    Family History  Problem Relation Age of Onset  . Hypertension Mother   . Heart disease Father   . Hypertension Father     History  Substance Use Topics  . Smoking status: Former Games developer  . Smokeless tobacco: Not on file  . Alcohol Use: No    OB History    Grav Para Term Preterm Abortions TAB SAB Ect Mult Living   3 3 3       3       Review of Systems  Constitutional: Negative for fever and chills.  Respiratory: Negative for cough, shortness of breath and wheezing.   Cardiovascular: Positive for chest pain and palpitations.  Gastrointestinal: Negative for nausea, vomiting and abdominal pain.  Neurological: Positive for dizziness, weakness and light-headedness.    Allergies  Escitalopram oxalate; Mepergan; Toprol xl; Bupropion; Cholestatin; Codeine; Effexor; Paroxetine hcl; Ramipril; and Sertraline hcl  Home Medications   Current Outpatient Rx  Name Route Sig Dispense Refill  . VITAMIN C PO Oral Take 1 tablet by mouth daily.    Marland Kitchen VITAMIN B COMPLEX PO Oral Take 1 tablet by mouth daily.    Marland Kitchen CITALOPRAM HYDROBROMIDE 20 MG PO  TABS Oral Take 20 mg by mouth daily.    . ERGOCALCIFEROL 50000 UNITS PO CAPS Oral Take 50,000 Units by mouth 2 (two) times a week.     Marland Kitchen FERROUS SULFATE 325 (65 FE) MG PO TABS Oral Take 325 mg by mouth daily with breakfast.    . LEVOTHYROXINE SODIUM 50 MCG PO TABS Oral Take 50 mcg by mouth daily.    Marland Kitchen LORAZEPAM 1 MG PO TABS Oral Take 1 mg by mouth 3 (three) times daily as needed. For anxiety    . ADULT MULTIVITAMIN W/MINERALS CH Oral Take 1 tablet by mouth daily.    Marland Kitchen NAPROXEN SODIUM 220 MG PO TABS Oral Take 220 mg by mouth daily as  needed. For pain    . LOVAZA PO Oral Take 2 capsules by mouth 2 (two) times daily.     Marland Kitchen OMEPRAZOLE 20 MG PO CPDR Oral Take 20 mg by mouth daily.      Marland Kitchen POLYSACCHARIDE IRON 150 MG PO CAPS Oral Take 300 mg by mouth daily.     Marland Kitchen VALSARTAN-HYDROCHLOROTHIAZIDE 320-12.5 MG PO TABS Oral Take 1 tablet by mouth daily.      BP 144/85  Pulse 94  Temp 98.1 F (36.7 C)  Resp 12  SpO2 95%  Physical Exam  Nursing note and vitals reviewed. Constitutional: She is oriented to person, place, and time. She appears well-developed and well-nourished. No distress.  HENT:  Head: Normocephalic.  Eyes: Conjunctivae and EOM are normal. Pupils are equal, round, and reactive to light.  Neck: Neck supple.  Cardiovascular: Normal rate, regular rhythm and normal heart sounds.   Pulmonary/Chest: Effort normal and breath sounds normal. No respiratory distress. She has no wheezes. She has no rales.  Abdominal: Soft. Bowel sounds are normal. She exhibits no distension. There is no tenderness.  Musculoskeletal: Normal range of motion. She exhibits no edema.  Neurological: She is alert and oriented to person, place, and time.  Skin: Skin is warm and dry.  Psychiatric:       Flat affect    ED Course  Procedures (including critical care time)  Pt in NAD. No current pain. Pt very anxious, concerned about her BP. Here it is 144/85. ECG unremarkable.    Date: 12/19/2011  Rate: 92  Rhythm: normal sinus rhythm  QRS Axis: normal  Intervals: normal  ST/T Wave abnormalities: nonspecific T wave changes  Conduction Disutrbances:none  Narrative Interpretation:   Old EKG Reviewed: unchanged  Will get labs. Monitor.   Results for orders placed during the hospital encounter of 12/19/11  CBC      Component Value Range   WBC 10.8 (*) 4.0 - 10.5 (K/uL)   RBC 5.20 (*) 3.87 - 5.11 (MIL/uL)   Hemoglobin 14.8  12.0 - 15.0 (g/dL)   HCT 81.1  91.4 - 78.2 (%)   MCV 80.6  78.0 - 100.0 (fL)   MCH 28.5  26.0 - 34.0 (pg)    MCHC 35.3  30.0 - 36.0 (g/dL)   RDW 95.6  21.3 - 08.6 (%)   Platelets 287  150 - 400 (K/uL)  DIFFERENTIAL      Component Value Range   Neutrophils Relative 77  43 - 77 (%)   Neutro Abs 8.3 (*) 1.7 - 7.7 (K/uL)   Lymphocytes Relative 15  12 - 46 (%)   Lymphs Abs 1.6  0.7 - 4.0 (K/uL)   Monocytes Relative 8  3 - 12 (%)   Monocytes Absolute 0.8  0.1 - 1.0 (  K/uL)   Eosinophils Relative 1  0 - 5 (%)   Eosinophils Absolute 0.1  0.0 - 0.7 (K/uL)   Basophils Relative 0  0 - 1 (%)   Basophils Absolute 0.0  0.0 - 0.1 (K/uL)  BASIC METABOLIC PANEL      Component Value Range   Sodium 139  135 - 145 (mEq/L)   Potassium 3.0 (*) 3.5 - 5.1 (mEq/L)   Chloride 99  96 - 112 (mEq/L)   CO2 27  19 - 32 (mEq/L)   Glucose, Bld 124 (*) 70 - 99 (mg/dL)   BUN 8  6 - 23 (mg/dL)   Creatinine, Ser 2.95  0.50 - 1.10 (mg/dL)   Calcium 9.6  8.4 - 28.4 (mg/dL)   GFR calc non Af Amer >90  >90 (mL/min)   GFR calc Af Amer >90  >90 (mL/min)  POCT I-STAT TROPONIN I      Component Value Range   Troponin i, poc 0.00  0.00 - 0.08 (ng/mL)   Comment 3            Dg Chest 2 View  12/19/2011  *RADIOLOGY REPORT*  Clinical Data: Chest pain for 2 days with hypertension.  CHEST - 2 VIEW  Comparison: 09/29/2010  Findings: Midline trachea.  Borderline cardiomegaly. Mediastinal contours otherwise within normal limits.  No pleural effusion or pneumothorax.  Mild volume loss at the left lung base. Right lung clear.  IMPRESSION: No acute cardiopulmonary disease.  Original Report Authenticated By: Consuello Bossier, M.D.     8:31 PM Potassium replaced. Pt's family by bedside. Per family, pt very anxious, has appt with psychiatrist tomorrow. Suspect panic attack. Will follow up with PCP asap. Doubt PE based on VS and no current CP or SOB. Doubt ACS, atypical pain, negative troponin, low risk.    1. Chest pain   2. Anxiety       MDM         Lottie Mussel, PA 12/20/11 0003

## 2011-12-19 NOTE — ED Notes (Signed)
Pt states "I think this (chest pain) is from them changing my medications" Pt states she was taken off some of her medications and dose changes on some meds. Pt appears anxious. Family at bedside.

## 2011-12-19 NOTE — ED Notes (Signed)
Pt states she had a ativan earlier and does not feel like she needs another Ativan right now.

## 2011-12-19 NOTE — Discharge Instructions (Signed)
Your chest x-ray and labs are normal today except for slightly low potassium level. Make sure to call and follow up with your primary care doctor as soon as able for further evaluation and recheck. Follow up with your psychiatrist tomorrow, please inform them of your anxiety and see if they have any more ideas about treatment. Return if worsening.   Anxiety and Panic Attacks Your caregiver has informed you that you are having an anxiety or panic attack. There may be many forms of this. Most of the time these attacks come suddenly and without warning. They come at any time of day, including periods of sleep, and at any time of life. They may be strong and unexplained. Although panic attacks are very scary, they are physically harmless. Sometimes the cause of your anxiety is not known. Anxiety is a protective mechanism of the body in its fight or flight mechanism. Most of these perceived danger situations are actually nonphysical situations (such as anxiety over losing a job). CAUSES  The causes of an anxiety or panic attack are many. Panic attacks may occur in otherwise healthy people given a certain set of circumstances. There may be a genetic cause for panic attacks. Some medications may also have anxiety as a side effect. SYMPTOMS  Some of the most common feelings are:  Intense terror.   Dizziness, feeling faint.   Hot and cold flashes.   Fear of going crazy.   Feelings that nothing is real.   Sweating.   Shaking.   Chest pain or a fast heartbeat (palpitations).   Smothering, choking sensations.   Feelings of impending doom and that death is near.   Tingling of extremities, this may be from over-breathing.   Altered reality (derealization).   Being detached from yourself (depersonalization).  Several symptoms can be present to make up anxiety or panic attacks. DIAGNOSIS  The evaluation by your caregiver will depend on the type of symptoms you are experiencing. The diagnosis of  anxiety or panic attack is made when no physical illness can be determined to be a cause of the symptoms. TREATMENT  Treatment to prevent anxiety and panic attacks may include:  Avoidance of circumstances that cause anxiety.   Reassurance and relaxation.   Regular exercise.   Relaxation therapies, such as yoga.   Psychotherapy with a psychiatrist or therapist.   Avoidance of caffeine, alcohol and illegal drugs.   Prescribed medication.  SEEK IMMEDIATE MEDICAL CARE IF:   You experience panic attack symptoms that are different than your usual symptoms.   You have any worsening or concerning symptoms.  Document Released: 06/26/2005 Document Revised: 06/15/2011 Document Reviewed: 10/28/2009 [facility] Patient Information 2012 [place], [place].

## 2011-12-19 NOTE — ED Notes (Signed)
Pt brought in via EMS for CP x2 days. Pt recently d/c from [facility] where her anxiety medications were changed. Pt states CP started yesterday and decided to drive to the fire department today to be checked out. Pt anxious over her blood pressure being elevated and her medications being changed.

## 2011-12-19 NOTE — ED Notes (Signed)
Pt discharged home in stable condition with family. Pt refused wheelchair. Discharge instructions reviewed with pt, pt denies questions at this time and verbalizes understanding of instructions including need for follow up appt with provider.

## 2011-12-20 NOTE — ED Provider Notes (Signed)
Medical screening examination/treatment/procedure(s) were performed by non-physician practitioner and as supervising physician I was immediately available for consultation/collaboration.  Maybell Misenheimer R. Giana Castner, MD 12/20/11 0015 

## 2011-12-21 ENCOUNTER — Other Ambulatory Visit: Payer: Self-pay | Admitting: Dermatology

## 2012-01-30 ENCOUNTER — Other Ambulatory Visit: Payer: Self-pay | Admitting: Family Medicine

## 2012-01-30 DIAGNOSIS — R51 Headache: Secondary | ICD-10-CM

## 2012-02-01 ENCOUNTER — Other Ambulatory Visit: Payer: Self-pay | Admitting: Dermatology

## 2012-02-02 ENCOUNTER — Ambulatory Visit
Admission: RE | Admit: 2012-02-02 | Discharge: 2012-02-02 | Disposition: A | Discharge status: Home / Self Care | Payer: BC Managed Care – PPO | Source: Ambulatory Visit | Attending: Family Medicine | Admitting: Family Medicine

## 2012-02-02 DIAGNOSIS — R51 Headache: Secondary | ICD-10-CM

## 2012-02-02 MED ORDER — GADOBENATE DIMEGLUMINE 529 MG/ML IV SOLN
10.0000 mL | Freq: Once | INTRAVENOUS | Status: AC | PRN
  Administered 2012-02-02: 10 mL via INTRAVENOUS

## 2012-08-15 ENCOUNTER — Other Ambulatory Visit: Payer: Self-pay | Admitting: Dermatology

## 2012-08-28 ENCOUNTER — Encounter: Payer: Self-pay | Admitting: Internal Medicine

## 2012-09-12 ENCOUNTER — Telehealth: Payer: Self-pay | Admitting: *Deleted

## 2012-09-12 NOTE — Telephone Encounter (Signed)
DR Juanda Chance, THIS PT IS SCHEDULED FOR A  DIRECT COLONOSCOPY ON 10-01-12 AT 11AM PER REFERRAL DR CYNTHIA WHITE. I CALLED PT TO SEE REASON FOR REFERRAL AND PT STATES SHE HAS FAMILY HISTORY OF COLON POLYPS IN FATHER AND SISTER, NO FAMILY HX OF COLON CANCER ALTHOUGH SHE HAD A GRANDMOTHER WITH HX OF PANCREATIC CANCER SHE THINKS, BUT THAT ISN'T CLEAR. Marland Kitchen PT ALSO HAS HX OF REFLUX, IBS ( USES A LOT OF IMMODIUM) AND HEMS.  SHE HAS A LOT OF HEALTH PROBLEMS PER THE PT AND SHE SAID DR WHITE THOUGHT A COLON WAS APPROPRIATE. JUST WANT TO VERIFY THIS IS OKAY OR SEE IF PT NEEDS AN OFFICE VISIT PRIOR TO HER COLONOSCOPY. Lynford Humphrey A LOT MARIE- PRE VISIT.

## 2012-09-12 NOTE — Telephone Encounter (Signed)
Pt is not AA so will schedule an office visit with dr Juanda Chance and cancel the pending colonoscopy until after the OV. i called pt at 1655 09-12-12 and lefta  Detailed message that py needs to call and schedule an appt to see dr Juanda Chance. i will canc the previsit and colon that's pending as well. ewm

## 2012-09-12 NOTE — Telephone Encounter (Signed)
View

## 2012-09-12 NOTE — Telephone Encounter (Signed)
If she is african-american then she can have a direct colonoscopy based on the guidelines, but if she is  Not an AA then the she needs an OV first because the guidelines  Suggest age 47 to start screening.

## 2012-09-24 ENCOUNTER — Encounter: Payer: Self-pay | Admitting: *Deleted

## 2012-09-24 NOTE — Telephone Encounter (Signed)
Pt has appt scheduled with dr Juanda Chance for 11-25-12. ewm

## 2012-10-01 ENCOUNTER — Encounter: Payer: BC Managed Care – PPO | Admitting: Internal Medicine

## 2012-10-10 ENCOUNTER — Ambulatory Visit (HOSPITAL_COMMUNITY)
Admission: RE | Admit: 2012-10-10 | Discharge: 2012-10-10 | Disposition: A | Discharge status: Home / Self Care | Payer: BC Managed Care – PPO | Attending: Psychiatry | Admitting: Psychiatry
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
## 2012-10-16 ENCOUNTER — Other Ambulatory Visit (HOSPITAL_COMMUNITY): Payer: BC Managed Care – PPO | Attending: Psychiatry | Admitting: Psychiatry

## 2012-10-16 ENCOUNTER — Encounter (HOSPITAL_COMMUNITY): Payer: Self-pay

## 2012-10-16 DIAGNOSIS — F329 Major depressive disorder, single episode, unspecified: Secondary | ICD-10-CM

## 2012-10-16 DIAGNOSIS — F411 Generalized anxiety disorder: Secondary | ICD-10-CM | POA: Insufficient documentation

## 2012-10-16 DIAGNOSIS — F32A Depression, unspecified: Secondary | ICD-10-CM | POA: Insufficient documentation

## 2012-10-16 DIAGNOSIS — F331 Major depressive disorder, recurrent, moderate: Secondary | ICD-10-CM

## 2012-10-16 DIAGNOSIS — F332 Major depressive disorder, recurrent severe without psychotic features: Secondary | ICD-10-CM | POA: Insufficient documentation

## 2012-10-16 DIAGNOSIS — F419 Anxiety disorder, unspecified: Secondary | ICD-10-CM

## 2012-10-16 DIAGNOSIS — F410 Panic disorder [episodic paroxysmal anxiety] without agoraphobia: Secondary | ICD-10-CM | POA: Insufficient documentation

## 2012-10-16 NOTE — Progress Notes (Signed)
    Daily Group Progress Note  Program: IOP  Group Time: 9:00-10:30 am   Participation Level: Active  Behavioral Response: Appropriate and Sharing  Type of Therapy:  Process Group  Summary of Progress: This is patients second time being referred to the IOP program. The last time she did not complete it and participated for a couple of days. Patient was introduced, appeared attentive and observed the group process.      Group Time: 10:30 am - 12:00 pm   Participation Level:  Active  Behavioral Response: Appropriate  Type of Therapy: Psycho-education Group  Summary of Progress: Patient learned more about the DBT of distress tolerance the skills used to manage difficult feelings. Patient was given homework to practice one of the skills for discussion tomorrow.  Carman Ching, LCSW
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
## 2012-10-17 ENCOUNTER — Other Ambulatory Visit: Payer: Self-pay | Admitting: Obstetrics & Gynecology

## 2012-10-17 ENCOUNTER — Other Ambulatory Visit (HOSPITAL_COMMUNITY): Payer: BC Managed Care – PPO | Admitting: Psychiatry

## 2012-10-17 DIAGNOSIS — N6324 Unspecified lump in the left breast, lower inner quadrant: Secondary | ICD-10-CM

## 2012-10-17 DIAGNOSIS — F329 Major depressive disorder, single episode, unspecified: Secondary | ICD-10-CM

## 2012-10-17 MED ORDER — MIRTAZAPINE 15 MG PO TABS: 15.0000 mg | ORAL_TABLET | Freq: Every day | ORAL | Status: DC

## 2012-10-17 MED ORDER — LORAZEPAM 1 MG PO TABS: 1.0000 mg | ORAL_TABLET | Freq: Three times a day (TID) | ORAL | Status: DC | PRN

## 2012-10-18 ENCOUNTER — Other Ambulatory Visit (HOSPITAL_COMMUNITY): Payer: BC Managed Care – PPO | Admitting: Psychiatry

## 2012-10-18 DIAGNOSIS — F329 Major depressive disorder, single episode, unspecified: Secondary | ICD-10-CM

## 2012-10-18 NOTE — Progress Notes (Signed)
    Daily Group Progress Note  Program: IOP  Group Time: 9:00-10:30 am   Participation Level: Active  Behavioral Response: Appropriate  Type of Therapy:  Process Group  Summary of Progress: Patient arrived forty minutes late and appeared stressed and unfocused. She almost appeared to be "in a daze". Her gross motor skills were delayed and she had difficulty focusing and concentrating. Patient talked about having high anxiety symptoms and continued panic attacks. She asked the group why she would be having anxiety and continued to ask how to fix it. Patient was redirected from doing this repeatedly. Patient then began talking about pressure she receives from her husband and his high expectations of her that cause her to fear he will leave her if she does not meet his expectations. Group members tried to help patient see that as a reason for feeling anxious, but patient appeared resistant to accepting that and continued to seek alternative causes and fixes. Patient presents with low self-esteem and appears to seek answers and approval from others. She is working improving self-esteem and feeling confident in finding her own causes of anxiety.      Group Time: 10:30 am - 12:00 pm   Participation Level:  Active  Behavioral Response: Appropriate  Type of Therapy: Psycho-education Group  Summary of Progress: Patient participated in a group focused on identifying values and exploring how this affects current stressors and depression.  Carman Ching, LCSW

## 2012-10-18 NOTE — Progress Notes (Signed)
    Daily Group Progress Note  Program: IOP  Group Time: 9:00-10:30 am   Participation Level: Active  Behavioral Response: Sharing  Type of Therapy:  Process Group  Summary of Progress: Patient reports high anxiety symptoms but struggles to understand the cause. She seeks possible causes by asking the group facilitator and other group members frequently for what causes their symptoms. Patient appears to have low self-esteem and seeks approval from others. Patient is working on increasing her own sense of self-worth and exploring causes of her anxiety instead of seeking approval from outside herself.      Group Time: 10:30 am - 12:00 pm   Participation Level:  Active  Behavioral Response: Appropriate  Type of Therapy: Psycho-education Group  Summary of Progress: Patient finished learning about the DBT skill of distress tolerance and how to replace unhealthy coping skills with healthy ones to mange stress.   Carman Ching, LCSW

## 2012-10-21 ENCOUNTER — Other Ambulatory Visit (HOSPITAL_COMMUNITY): Payer: BC Managed Care – PPO

## 2012-10-21 ENCOUNTER — Telehealth (HOSPITAL_COMMUNITY): Payer: Self-pay | Admitting: Psychiatry

## 2012-10-22 ENCOUNTER — Other Ambulatory Visit (HOSPITAL_COMMUNITY): Payer: BC Managed Care – PPO | Admitting: Psychiatry

## 2012-10-22 DIAGNOSIS — F329 Major depressive disorder, single episode, unspecified: Secondary | ICD-10-CM
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
## 2012-10-23 ENCOUNTER — Other Ambulatory Visit (HOSPITAL_COMMUNITY): Payer: BC Managed Care – PPO | Admitting: Psychiatry

## 2012-10-23 DIAGNOSIS — F329 Major depressive disorder, single episode, unspecified: Secondary | ICD-10-CM

## 2012-10-23 NOTE — Progress Notes (Signed)
    Daily Group Progress Note  Program: IOP  Group Time: 9:00-10:30 am   Participation Level: Active  Behavioral Response: Appropriate  Type of Therapy:  Process Group  Summary of Progress: Patient was introduced to defining depression as a medical condition and reducing stigma associated with it. Patient processed thoughts about depression and personal beliefs about the illness.      Group Time: 10:30 am - 12:00 pm   Participation Level:  Active  Behavioral Response: Appropriate  Type of Therapy: Psycho-education Group  Summary of Progress: Patient learned the causes and symptoms with depression to better understand what they are being treated for and how to identify signs of progress and relapse.   Kelten Enochs E, LCSW 

## 2012-10-23 NOTE — Progress Notes (Signed)
    Daily Group Progress Note  Program: IOP  Group Time: 9:00-10:30 am   Participation Level: Active  Behavioral Response: Appropriate  Type of Therapy:  Process Group  Summary of Progress: Patient presents with depressed mood, difficulty problem solving and making decisions and looking to others for answers. She is working on improving her self esteem and states she did her homework assignment of doing self-care last night. Patient is working on reducing co-depended behaviors.      Group Time: 10:30 am - 12:00 pm   Participation Level:  Active  Behavioral Response: Appropriate  Type of Therapy: Individual Therapy  Summary of Progress:  Patient participated in learning how to use the CBT skill of re framing to challenge negative thoughts and replace them with thoughts to reduce stress and depression.   Carman Ching, LCSW

## 2012-10-24 ENCOUNTER — Other Ambulatory Visit (HOSPITAL_COMMUNITY): Payer: BC Managed Care – PPO | Admitting: Psychiatry

## 2012-10-24 DIAGNOSIS — F329 Major depressive disorder, single episode, unspecified: Secondary | ICD-10-CM

## 2012-10-24 NOTE — Progress Notes (Signed)
    Daily Group Progress Note  Program: IOP  Group Time: 9:00-10:30 am   Participation Level: Active  Behavioral Response: Appropriate  Type of Therapy:  Process Group  Summary of Progress: Patient arrived an hour late to group today and appeared frazzled, disheveled and anxious. She did not provide an explanation for why she was late. This the third day she has arrived more than thirty minutes late without an explanation. Patient met with the case manage to explore barriers to attending on time and a plan was created to allow her to be on time going forward. Limits were set for her that if she is unable to attend the program during the time it is offered that she can be referred to a different level of care that can better meet her needs. Patient appeared confused about why the issue of tardiness was set for her and made the comment that she felt "repimanded". Patient struggled to take personal responsibility over her need to attend the program during program times.       Group Time: 10:30 am - 12:00 pm   Participation Level:  Active  Behavioral Response: Appropriate  Type of Therapy: Psycho-education Group  Summary of Progress:  Patient learned about anxiety and was educated on the symptoms associated with it. Patient learned how to identify the symptoms and discussed different treatment approaches.   Carman Ching, LCSW

## 2012-10-24 NOTE — Progress Notes (Signed)
Patient reviewed and interviewed today, states that she did not go to her primary care provider yesterday and took an extra antihypertensive medication which lowered her blood pressure and went home. Patient today states that she took the Remeron for 3 days then discontinued it because she felt that it was making her more depressed. Tried to process the mechanism of antidepressant but patient has a hard time accepting it patient prefers taking her Ativan. Denies suicidal or homicidal ideation. No hallucinations or delusions Discussed completing the program and patient will be discharged as scheduled next week.

## 2012-10-25 ENCOUNTER — Other Ambulatory Visit (HOSPITAL_COMMUNITY): Payer: BC Managed Care – PPO

## 2012-10-28 ENCOUNTER — Telehealth (HOSPITAL_COMMUNITY): Payer: Self-pay | Admitting: Psychiatry

## 2012-10-28 ENCOUNTER — Other Ambulatory Visit (HOSPITAL_COMMUNITY): Payer: BC Managed Care – PPO

## 2012-10-29 ENCOUNTER — Other Ambulatory Visit (HOSPITAL_COMMUNITY): Payer: BC Managed Care – PPO

## 2012-10-29 ENCOUNTER — Ambulatory Visit
Admission: RE | Admit: 2012-10-29 | Discharge: 2012-10-29 | Disposition: A | Discharge status: Home / Self Care | Payer: BC Managed Care – PPO | Source: Ambulatory Visit | Attending: Obstetrics & Gynecology | Admitting: Obstetrics & Gynecology

## 2012-10-29 ENCOUNTER — Telehealth (HOSPITAL_COMMUNITY): Payer: Self-pay | Admitting: Psychiatry

## 2012-10-29 DIAGNOSIS — N6324 Unspecified lump in the left breast, lower inner quadrant: Secondary | ICD-10-CM

## 2012-10-29 NOTE — Telephone Encounter (Signed)
D: Adrienne Freeman is a 47 y.o caucasian female. Pt presented with high anxiety and depressive symptoms. She states she has panic attacks multiple times a week and even calls the police because she is unaware that she is having one. Panic attacks have been occuring for 12 years. Pt denies any SI/HI/AV. Patient is struggling to remember to take medications and has fear over the side effects of her meds. Pt has extreme anxiety and obsessive thinking. Stressors: 1) Has a lot of anxiety over taking care of her house and family. She feels that her husband is very particular and she is unable to please him. 2) Pt struggles with weight and body image. She is very depressed over current body image and is struggling with weight gain.  Patient attended [facility] only for six days.  Towards the six day she was showing up late for the groups and missing days. A:  D/C pt due to non-compliancy with attendance.  Follow up with Dr. Betti Cruz and Abel Presto, [facility].  Encourage support groups.  R:  Pt receptive.

## 2012-10-29 NOTE — Progress Notes (Signed)
Discharge Note  Patient:  Adrienne Freeman is an 47 y.o., female DOB:  1965/12/06  Date of Admission: 10/16/12  Date of Discharge: 10/29/12  Reason for Admission: Depression and anxiety with panic attacks  Hospital Course: Patient started IOP and because of her depression was started on Remeron 3.75 mg by mouth each bedtime. Patient was started on this low dose as she stated her body was very sensitive to medications and she had been through numerous antidepressants. Patient said the medicines for 3 days then said that she didn't feel right and stopped it. She did not informed the staff that she had discontinued the medicine. The patient experienced anxiety and panic attacks in group was referred to her primary care physician since her blood pressure was high at 147/98 but she decided not to go as her blood pressure had come down. Patient skipped groups and was noncompliant and did not want to take any medications and then decided to do a no show for IOP.  Mental Status at Discharge: As off   10/24/12 patient was alert oriented x3, affect was anxious mood was also anxious speech was normal with no suicidal or homicidal ideation no hallucinations or delusions. Recent and remote memory was good, judgment and insight were fair concentration and recall was good.  Lab Results: No results found for this or any previous visit (from the past 48 hour(s)).  Current outpatient prescriptions:Ascorbic Acid (VITAMIN C PO), Take 1 tablet by mouth daily., Disp: , Rfl: ;  B Complex Vitamins (VITAMIN B COMPLEX PO), Take 1 tablet by mouth daily., Disp: , Rfl: ;  ergocalciferol (VITAMIN D2) 50000 UNITS capsule, Take 50,000 Units by mouth 2 (two) times a week. , Disp: , Rfl: ;  ferrous sulfate 325 (65 FE) MG tablet, Take 325 mg by mouth daily with breakfast., Disp: , Rfl:  levothyroxine (SYNTHROID, LEVOTHROID) 50 MCG tablet, Take 50 mcg by mouth daily., Disp: , Rfl: ;  LORazepam (ATIVAN) 1 MG tablet, Take 1 tablet (1 mg  total) by mouth 3 (three) times daily as needed. For anxiety, Disp: 90 tablet, Rfl: 0;  Multiple Vitamin (MULTIVITAMIN WITH MINERALS) TABS, Take 1 tablet by mouth daily., Disp: , Rfl: ;  Omega-3-acid Ethyl Esters (LOVAZA PO), Take 2 capsules by mouth 2 (two) times daily. , Disp: , Rfl:  polysaccharide iron (NIFEREX) 150 MG CAPS capsule, Take 300 mg by mouth daily. , Disp: , Rfl: ;  telmisartan-hydrochlorothiazide (MICARDIS HCT) 40-12.5 MG per tablet, Take 1 tablet by mouth daily., Disp: , Rfl:   Axis Diagnosis:   Axis I: Generalized Anxiety Disorder, Major Depression, Recurrent severe and Panic Disorder Axis II: Cluster B Traits Axis III:  Past Medical History  Diagnosis Date  . Sleep apnea   . GERD (gastroesophageal reflux disease)   . Hypercholesterolemia   . Anemia   . Fibroids     Uterine  . Hypertension   . Thyroid disorder     Chronic lymphocytic thyroiditis.  Marland Kitchen Hypothyroidism   . Anxiety   . Anemia   . Uterine polyp   . GERD (gastroesophageal reflux disease)   . Depression    Axis IV: economic problems, other psychosocial or environmental problems, problems related to social environment and problems with primary support group Axis V: 61-70 mild symptoms   Level of Care:  OP  Discharge destination:  Home  Is patient on multiple antipsychotic therapies at discharge:  No    Has Patient had three or more failed trials of antipsychotic monotherapy  by history:  No  Patient phone:  [phone] (home)  Patient address:   [address] [place] [address],   Follow-up recommendations:  Activity:  As tolerated Diet:  Regular Other:  Followup with Dr. Betti Cruz for medications and Abel Presto  for therapy  Comments:    The patient received suicide prevention pamphlet:  Yes   Margit Banda 10/29/2012, 12:23 PM

## 2012-10-29 NOTE — Patient Instructions (Signed)
Patient will be discharged today due to non-compliancy with attendance.  Will follow up with Dr. Betti Cruz and Abel Presto, [facility].  Encouraged support groups.

## 2012-10-30 ENCOUNTER — Other Ambulatory Visit (HOSPITAL_COMMUNITY): Payer: BC Managed Care – PPO

## 2012-10-31 ENCOUNTER — Other Ambulatory Visit (HOSPITAL_COMMUNITY): Payer: BC Managed Care – PPO

## 2012-11-01 ENCOUNTER — Other Ambulatory Visit (HOSPITAL_COMMUNITY): Payer: BC Managed Care – PPO

## 2012-11-04 ENCOUNTER — Other Ambulatory Visit (HOSPITAL_COMMUNITY): Payer: BC Managed Care – PPO

## 2012-11-05 ENCOUNTER — Other Ambulatory Visit (HOSPITAL_COMMUNITY): Payer: BC Managed Care – PPO

## 2012-11-06 ENCOUNTER — Other Ambulatory Visit (HOSPITAL_COMMUNITY): Payer: BC Managed Care – PPO

## 2012-11-07 ENCOUNTER — Other Ambulatory Visit (HOSPITAL_COMMUNITY): Payer: BC Managed Care – PPO

## 2012-11-08 ENCOUNTER — Other Ambulatory Visit (HOSPITAL_COMMUNITY): Payer: BC Managed Care – PPO

## 2012-11-11 ENCOUNTER — Other Ambulatory Visit (HOSPITAL_COMMUNITY): Payer: BC Managed Care – PPO

## 2012-11-12 ENCOUNTER — Other Ambulatory Visit (HOSPITAL_COMMUNITY): Payer: BC Managed Care – PPO

## 2012-11-13 ENCOUNTER — Other Ambulatory Visit (HOSPITAL_COMMUNITY): Payer: BC Managed Care – PPO

## 2012-11-25 ENCOUNTER — Ambulatory Visit: Payer: BC Managed Care – PPO | Admitting: Internal Medicine

## 2013-01-02 ENCOUNTER — Encounter: Payer: Self-pay | Admitting: *Deleted

## 2013-01-28 ENCOUNTER — Ambulatory Visit: Payer: Self-pay | Admitting: Internal Medicine

## 2013-06-10 ENCOUNTER — Ambulatory Visit: Payer: BC Managed Care – PPO | Attending: Obstetrics & Gynecology | Admitting: Physical Therapy

## 2013-06-10 DIAGNOSIS — IMO0001 Reserved for inherently not codable concepts without codable children: Secondary | ICD-10-CM | POA: Insufficient documentation

## 2013-06-10 DIAGNOSIS — M2420 Disorder of ligament, unspecified site: Secondary | ICD-10-CM | POA: Insufficient documentation

## 2013-06-10 DIAGNOSIS — M629 Disorder of muscle, unspecified: Secondary | ICD-10-CM | POA: Insufficient documentation

## 2013-06-12 ENCOUNTER — Ambulatory Visit: Payer: BC Managed Care – PPO | Admitting: Physical Therapy

## 2013-06-17 ENCOUNTER — Ambulatory Visit: Payer: BC Managed Care – PPO | Admitting: Physical Therapy

## 2013-06-19 ENCOUNTER — Ambulatory Visit: Payer: BC Managed Care – PPO | Admitting: Physical Therapy

## 2013-06-24 ENCOUNTER — Ambulatory Visit: Payer: BC Managed Care – PPO | Admitting: Physical Therapy

## 2013-06-26 ENCOUNTER — Ambulatory Visit: Payer: BC Managed Care – PPO | Admitting: Physical Therapy

## 2013-06-30 ENCOUNTER — Ambulatory Visit: Payer: BC Managed Care – PPO | Admitting: Physical Therapy

## 2013-07-08 ENCOUNTER — Ambulatory Visit: Payer: BC Managed Care – PPO | Admitting: Physical Therapy

## 2013-07-15 ENCOUNTER — Ambulatory Visit: Payer: BC Managed Care – PPO | Attending: Obstetrics & Gynecology | Admitting: Physical Therapy

## 2013-07-15 DIAGNOSIS — IMO0001 Reserved for inherently not codable concepts without codable children: Secondary | ICD-10-CM | POA: Insufficient documentation

## 2013-07-15 DIAGNOSIS — M2420 Disorder of ligament, unspecified site: Secondary | ICD-10-CM | POA: Insufficient documentation

## 2013-07-15 DIAGNOSIS — M629 Disorder of muscle, unspecified: Secondary | ICD-10-CM | POA: Insufficient documentation

## 2013-07-22 ENCOUNTER — Ambulatory Visit: Payer: BC Managed Care – PPO | Admitting: Physical Therapy

## 2013-07-28 ENCOUNTER — Encounter (HOSPITAL_COMMUNITY): Payer: Self-pay | Admitting: Pharmacist

## 2013-07-31 ENCOUNTER — Inpatient Hospital Stay (HOSPITAL_COMMUNITY): Admission: RE | Admit: 2013-07-31 | Payer: Self-pay | Source: Ambulatory Visit

## 2013-08-06 ENCOUNTER — Encounter (HOSPITAL_COMMUNITY)
Admission: RE | Admit: 2013-08-06 | Discharge: 2013-08-06 | Disposition: A | Discharge status: Home / Self Care | Payer: BC Managed Care – PPO | Source: Ambulatory Visit | Attending: Obstetrics & Gynecology | Admitting: Obstetrics & Gynecology

## 2013-08-06 ENCOUNTER — Encounter (HOSPITAL_COMMUNITY): Payer: Self-pay

## 2013-08-06 HISTORY — DX: Obsessive-compulsive disorder, unspecified: F42.9

## 2013-08-06 HISTORY — DX: Malignant (primary) neoplasm, unspecified: C80.1

## 2013-08-06 LAB — BASIC METABOLIC PANEL
BUN: 8 mg/dL (ref 6–23)
CHLORIDE: 99 meq/L (ref 96–112)
CO2: 30 mEq/L (ref 19–32)
CREATININE: 0.73 mg/dL (ref 0.50–1.10)
Calcium: 9.2 mg/dL (ref 8.4–10.5)
Glucose, Bld: 83 mg/dL (ref 70–99)
Potassium: 3.7 mEq/L (ref 3.7–5.3)
Sodium: 139 mEq/L (ref 137–147)

## 2013-08-06 LAB — CBC
HEMATOCRIT: 39.7 % (ref 36.0–46.0)
Hemoglobin: 13.9 g/dL (ref 12.0–15.0)
MCH: 29.3 pg (ref 26.0–34.0)
MCHC: 35 g/dL (ref 30.0–36.0)
MCV: 83.6 fL (ref 78.0–100.0)
Platelets: 228 10*3/uL (ref 150–400)
RBC: 4.75 MIL/uL (ref 3.87–5.11)
RDW: 13.2 % (ref 11.5–15.5)
WBC: 8.4 10*3/uL (ref 4.0–10.5)

## 2013-08-06 NOTE — H&P (Signed)
Adrienne Freeman is an 48 y.o. female with metrorrhagia and pelvic pain found to have an endometrial polyp on sonohysterogram.  Pertinent Gynecological History: Menses: with severe dysmenorrhea Bleeding: dysfunctional uterine bleeding Contraception: vasectomy DES exposure: unknown Blood transfusions: none Sexually transmitted diseases: no past history Previous GYN Procedures: DNC and laparoscopic myomectomy, endometrial ablation  Last mammogram: normal Date: 2014 Last pap: normal Date: 2014 OB History: G3P3   Menstrual History: Menarche age: n/a No LMP recorded. Patient is not currently having periods (Reason: Perimenopausal).    Past Medical History  Diagnosis Date  . Hypercholesterolemia   . Anemia   . Fibroids     Uterine  . Hypertension   . Thyroid disorder     Chronic lymphocytic thyroiditis.  Marland Kitchen Hypothyroidism   . Anxiety   . Anemia   . Uterine polyp   . Depression   . Cancer 01/2006    Left lower extremity malignant melanoma.  . SVD (spontaneous vaginal delivery)     x 3  . OCD (obsessive compulsive disorder)   . Sleep apnea     does not use CPAP every night  . GERD (gastroesophageal reflux disease)     no meds - diet controlled    Past Surgical History  Procedure Laterality Date  . Ablation    . Melanoma excision  2007    T1a melanoma from calf  . Wisdom tooth extraction    . Dilation and curettage of uterus    . Diagnostic laparoscopy      fibroids removed    Family History  Problem Relation Age of Onset  . Hypertension Mother   . OCD Mother   . Heart disease Father   . Hypertension Father   . Colon polyps Father   . Pancreatic cancer      grandmother??  . Alcohol abuse Sister   . OCD Maternal Aunt     Social History:  reports that she quit smoking about 29 years ago. Her smoking use included Cigarettes. She has a .5 pack-year smoking history. She has never used smokeless tobacco. She reports that she drinks alcohol. She reports that she does  not use illicit drugs.  Allergies:  Allergies  Allergen Reactions  . Escitalopram Oxalate Shortness Of Breath  . Mepergan [Meperidine-Promethazine] Other (See Comments)    Causes  Chest  Pain.  . Toprol Xl [Metoprolol Succinate] Shortness Of Breath  . Bupropion Other (See Comments)    Reaction unknown  . Cholestatin Other (See Comments)    Reaction unknown  . Codeine Other (See Comments)    Patient states that doctor told her that she was allergic to this medication.  . Effexor [Venlafaxine Hydrochloride] Other (See Comments)    Patient states that doctor told her that she was allergic to this medication.  . Paroxetine Hcl Other (See Comments)    Patient states that doctor told her that she was allergic to this medication.  . Ramipril Other (See Comments)    Patient states that doctor told her that she was allergic to this medication.  . Sertraline Hcl Other (See Comments)    Patient states that this medication makes her OCD worse.    No prescriptions prior to admission    ROS  There were no vitals taken for this visit. Physical Exam  Constitutional: She is oriented to person, place, and time. She appears well-developed and well-nourished.  GI: Soft. There is no rebound and no guarding.  Neurological: She is alert and oriented to person,  place, and time.  Skin: Skin is warm and dry.  Psychiatric: She has a normal mood and affect. Her behavior is normal.    Results for orders placed during the hospital encounter of 08/06/13 (from the past 24 hour(s))  BASIC METABOLIC PANEL     Status: None   Collection Time    08/06/13 10:57 AM      Result Value Range   Sodium 139  137 - 147 mEq/L   Potassium 3.7  3.7 - 5.3 mEq/L   Chloride 99  96 - 112 mEq/L   CO2 30  19 - 32 mEq/L   Glucose, Bld 83  70 - 99 mg/dL   BUN 8  6 - 23 mg/dL   Creatinine, Ser 0.73  0.50 - 1.10 mg/dL   Calcium 9.2  8.4 - 10.5 mg/dL   GFR calc non Af Amer >90  >90 mL/min   GFR calc Af Amer >90  >90 mL/min   CBC     Status: None   Collection Time    08/06/13 10:58 AM      Result Value Range   WBC 8.4  4.0 - 10.5 K/uL   RBC 4.75  3.87 - 5.11 MIL/uL   Hemoglobin 13.9  12.0 - 15.0 g/dL   HCT 39.7  36.0 - 46.0 %   MCV 83.6  78.0 - 100.0 fL   MCH 29.3  26.0 - 34.0 pg   MCHC 35.0  30.0 - 36.0 g/dL   RDW 13.2  11.5 - 15.5 %   Platelets 228  150 - 400 K/uL    No results found.  Assessment/Plan: 49 yo with endometrial poly -H/S, D&C  Shatasia Cutshaw 08/06/2013, 8:07 PM

## 2013-08-06 NOTE — Patient Instructions (Addendum)
   Your procedure is scheduled on:  Friday, Jan 30  Enter through the Main Entrance of [facility] at: 6 AM Pick up the phone at the desk and dial [phone] and inform us of your arrival.  Please call this number if you have any problems the morning of surgery: [phone]  Remember: Do not eat or drink after midnight: Thursday Take these medicines the morning of surgery with a SIP OF WATER: celexa, synthroid, micardis hct, ativan if needed  Do not wear jewelry, make-up, or FINGER nail polish No metal in your hair or on your body. Do not wear lotions, powders, perfumes.  You may wear deodorant.  Do not bring valuables to the hospital. Contacts, dentures or bridgework may not be worn into surgery.  Patients discharged on the day of surgery will not be allowed to drive home.   Home with husband Nicole [employer] cell [phone]

## 2013-08-08 ENCOUNTER — Encounter (HOSPITAL_COMMUNITY)
Admission: RE | Disposition: A | Discharge status: Home / Self Care | Payer: Self-pay | Source: Ambulatory Visit | Attending: Obstetrics & Gynecology

## 2013-08-08 ENCOUNTER — Ambulatory Visit (HOSPITAL_COMMUNITY)
Admission: RE | Admit: 2013-08-08 | Discharge: 2013-08-08 | Disposition: A | Discharge status: Home / Self Care | Payer: BC Managed Care – PPO | Source: Ambulatory Visit | Attending: Obstetrics & Gynecology | Admitting: Obstetrics & Gynecology

## 2013-08-08 ENCOUNTER — Encounter (HOSPITAL_COMMUNITY): Payer: BC Managed Care – PPO | Admitting: Anesthesiology

## 2013-08-08 ENCOUNTER — Ambulatory Visit (HOSPITAL_COMMUNITY): Payer: BC Managed Care – PPO | Admitting: Anesthesiology

## 2013-08-08 ENCOUNTER — Encounter (HOSPITAL_COMMUNITY): Payer: Self-pay | Admitting: *Deleted

## 2013-08-08 DIAGNOSIS — E039 Hypothyroidism, unspecified: Secondary | ICD-10-CM | POA: Insufficient documentation

## 2013-08-08 DIAGNOSIS — I10 Essential (primary) hypertension: Secondary | ICD-10-CM | POA: Insufficient documentation

## 2013-08-08 DIAGNOSIS — Z87891 Personal history of nicotine dependence: Secondary | ICD-10-CM | POA: Insufficient documentation

## 2013-08-08 DIAGNOSIS — K219 Gastro-esophageal reflux disease without esophagitis: Secondary | ICD-10-CM | POA: Insufficient documentation

## 2013-08-08 DIAGNOSIS — G4730 Sleep apnea, unspecified: Secondary | ICD-10-CM | POA: Insufficient documentation

## 2013-08-08 DIAGNOSIS — N946 Dysmenorrhea, unspecified: Secondary | ICD-10-CM | POA: Insufficient documentation

## 2013-08-08 DIAGNOSIS — F429 Obsessive-compulsive disorder, unspecified: Secondary | ICD-10-CM | POA: Insufficient documentation

## 2013-08-08 DIAGNOSIS — N949 Unspecified condition associated with female genital organs and menstrual cycle: Secondary | ICD-10-CM | POA: Insufficient documentation

## 2013-08-08 DIAGNOSIS — N938 Other specified abnormal uterine and vaginal bleeding: Secondary | ICD-10-CM | POA: Insufficient documentation

## 2013-08-08 DIAGNOSIS — N921 Excessive and frequent menstruation with irregular cycle: Secondary | ICD-10-CM | POA: Insufficient documentation

## 2013-08-08 DIAGNOSIS — E063 Autoimmune thyroiditis: Secondary | ICD-10-CM | POA: Insufficient documentation

## 2013-08-08 HISTORY — PX: HYSTEROSCOPY WITH D & C: SHX1775

## 2013-08-08 LAB — PREGNANCY, URINE: Preg Test, Ur: NEGATIVE

## 2013-08-08 SURGERY — DILATATION AND CURETTAGE /HYSTEROSCOPY
Anesthesia: General | Site: Vagina

## 2013-08-08 MED ORDER — LIDOCAINE HCL (CARDIAC) 20 MG/ML IV SOLN
INTRAVENOUS | Status: DC | PRN
  Administered 2013-08-08: 100 mg via INTRAVENOUS

## 2013-08-08 MED ORDER — MIDAZOLAM HCL 2 MG/2ML IJ SOLN
INTRAMUSCULAR | Status: AC
  Filled 2013-08-08: qty 2

## 2013-08-08 MED ORDER — EPHEDRINE 5 MG/ML INJ
INTRAVENOUS | Status: AC
  Filled 2013-08-08: qty 10

## 2013-08-08 MED ORDER — KETOROLAC TROMETHAMINE 30 MG/ML IJ SOLN
INTRAMUSCULAR | Status: AC
  Filled 2013-08-08: qty 1

## 2013-08-08 MED ORDER — GLYCINE 1.5 % IR SOLN
Status: DC | PRN
  Administered 2013-08-08: 3000 mL

## 2013-08-08 MED ORDER — LIDOCAINE HCL (CARDIAC) 20 MG/ML IV SOLN
INTRAVENOUS | Status: AC
  Filled 2013-08-08: qty 5

## 2013-08-08 MED ORDER — METOCLOPRAMIDE HCL 5 MG/ML IJ SOLN: 10.0000 mg | Freq: Once | INTRAMUSCULAR | Status: DC | PRN

## 2013-08-08 MED ORDER — KETOROLAC TROMETHAMINE 30 MG/ML IJ SOLN: 15.0000 mg | Freq: Once | INTRAMUSCULAR | Status: DC | PRN

## 2013-08-08 MED ORDER — FENTANYL CITRATE 0.05 MG/ML IJ SOLN: 25.0000 ug | INTRAMUSCULAR | Status: DC | PRN

## 2013-08-08 MED ORDER — FENTANYL CITRATE 0.05 MG/ML IJ SOLN
INTRAMUSCULAR | Status: AC
  Filled 2013-08-08: qty 2

## 2013-08-08 MED ORDER — MIDAZOLAM HCL 2 MG/2ML IJ SOLN
INTRAMUSCULAR | Status: DC | PRN
  Administered 2013-08-08: 2 mg via INTRAVENOUS

## 2013-08-08 MED ORDER — ONDANSETRON HCL 4 MG/2ML IJ SOLN
INTRAMUSCULAR | Status: AC
  Filled 2013-08-08: qty 2

## 2013-08-08 MED ORDER — FENTANYL CITRATE 0.05 MG/ML IJ SOLN
INTRAMUSCULAR | Status: DC | PRN
  Administered 2013-08-08: 100 ug via INTRAVENOUS

## 2013-08-08 MED ORDER — LACTATED RINGERS IV SOLN
INTRAVENOUS | Status: DC
  Administered 2013-08-08: 08:00:00 via INTRAVENOUS

## 2013-08-08 MED ORDER — PROPOFOL 10 MG/ML IV EMUL
INTRAVENOUS | Status: AC
  Filled 2013-08-08: qty 20

## 2013-08-08 MED ORDER — KETOROLAC TROMETHAMINE 30 MG/ML IJ SOLN
INTRAMUSCULAR | Status: DC | PRN
  Administered 2013-08-08: 30 mg via INTRAVENOUS

## 2013-08-08 MED ORDER — PROPOFOL 10 MG/ML IV BOLUS
INTRAVENOUS | Status: DC | PRN
  Administered 2013-08-08: 5 mg via INTRAVENOUS
  Administered 2013-08-08: 150 mg via INTRAVENOUS

## 2013-08-08 MED ORDER — DEXTROSE IN LACTATED RINGERS 5 % IV SOLN
INTRAVENOUS | Status: DC
Start: 2013-08-08 — End: 2013-08-08

## 2013-08-08 MED ORDER — 0.9 % SODIUM CHLORIDE (POUR BTL) OPTIME
TOPICAL | Status: DC | PRN
  Administered 2013-08-08: 1000 mL

## 2013-08-08 MED ORDER — IBUPROFEN 600 MG PO TABS: 600.0000 mg | ORAL_TABLET | Freq: Four times a day (QID) | ORAL | Status: DC | PRN

## 2013-08-08 SURGICAL SUPPLY — 19 items
ABLATOR ENDOMETRIAL BIPOLAR (ABLATOR) IMPLANT
CANISTER SUCT 3000ML (MISCELLANEOUS) ×2 IMPLANT
CATH ROBINSON RED A/P 16FR (CATHETERS) ×2 IMPLANT
CATH THERMACHOICE III (CATHETERS) IMPLANT
CLOTH BEACON ORANGE TIMEOUT ST (SAFETY) ×2 IMPLANT
CONTAINER PREFILL 10% NBF 60ML (FORM) ×4 IMPLANT
DRSG TELFA 3X8 NADH (GAUZE/BANDAGES/DRESSINGS) ×2 IMPLANT
ELECT REM PT RETURN 9FT ADLT (ELECTROSURGICAL) ×2
ELECTRODE REM PT RTRN 9FT ADLT (ELECTROSURGICAL) ×1 IMPLANT
GLOVE BIO SURGEON STRL SZ 6 (GLOVE) ×2 IMPLANT
GLOVE BIOGEL PI IND STRL 6 (GLOVE) ×2 IMPLANT
GLOVE BIOGEL PI INDICATOR 6 (GLOVE) ×2
GOWN STRL REUS W/TWL LRG LVL3 (GOWN DISPOSABLE) ×4 IMPLANT
LOOP ANGLED CUTTING 22FR (CUTTING LOOP) IMPLANT
PACK HYSTEROSCOPY LF (CUSTOM PROCEDURE TRAY) ×2 IMPLANT
PAD DRESSING TELFA 3X8 NADH (GAUZE/BANDAGES/DRESSINGS) ×1 IMPLANT
PAD OB MATERNITY 4.3X12.25 (PERSONAL CARE ITEMS) ×2 IMPLANT
TOWEL OR 17X24 6PK STRL BLUE (TOWEL DISPOSABLE) ×4 IMPLANT
WATER STERILE IRR 1000ML POUR (IV SOLUTION) ×2 IMPLANT

## 2013-08-08 NOTE — Transfer of Care (Signed)
Immediate Anesthesia Transfer of Care Note  Patient: Adrienne Freeman  Procedure(s) Performed: Procedure(s): DILATATION AND CURETTAGE /HYSTEROSCOPY (N/A)  Patient Location: PACU  Anesthesia Type:General  Level of Consciousness: awake, alert  and oriented  Airway & Oxygen Therapy: Patient Spontanous Breathing and Patient connected to nasal cannula oxygen  Post-op Assessment: Report given to PACU RN and Post -op Vital signs reviewed and stable  Post vital signs: Reviewed and stable  Complications: No apparent anesthesia complications

## 2013-08-08 NOTE — Anesthesia Preprocedure Evaluation (Signed)
Anesthesia Evaluation  Patient identified by MRN, date of birth, ID band Patient awake    Reviewed: Allergy & Precautions, H&P , NPO status , Patient's Chart, lab work & pertinent test results, reviewed documented beta blocker date and time   History of Anesthesia Complications Negative for: history of anesthetic complications  Airway Mallampati: III TM Distance: >3 FB Neck ROM: full    Dental  (+) Teeth Intact   Pulmonary sleep apnea and Continuous Positive Airway Pressure Ventilation , former smoker,  breath sounds clear to auscultation  Pulmonary exam normal       Cardiovascular hypertension, On Medications Rhythm:regular Rate:Normal     Neuro/Psych PSYCHIATRIC DISORDERS (anxiety, depression, OCD) negative neurological ROS     GI/Hepatic Neg liver ROS, GERD-  ,  Endo/Other  Hypothyroidism Morbid obesity  Renal/GU negative Renal ROS  Female GU complaint     Musculoskeletal   Abdominal   Peds  Hematology negative hematology ROS (+)   Anesthesia Other Findings See allergy list  - extensive  Reproductive/Obstetrics negative OB ROS                           Anesthesia Physical Anesthesia Plan  ASA: III  Anesthesia Plan: General LMA   Post-op Pain Management:    Induction:   Airway Management Planned:   Additional Equipment:   Intra-op Plan:   Post-operative Plan:   Informed Consent: I have reviewed the patients History and Physical, chart, labs and discussed the procedure including the risks, benefits and alternatives for the proposed anesthesia with the patient or authorized representative who has indicated his/her understanding and acceptance.   Dental Advisory Given  Plan Discussed with: CRNA and Surgeon  Anesthesia Plan Comments:         Anesthesia Quick Evaluation

## 2013-08-08 NOTE — Progress Notes (Signed)
No change to H&P. 

## 2013-08-08 NOTE — Anesthesia Procedure Notes (Signed)
Procedure Name: LMA Insertion Date/Time: 08/08/2013 7:38 AM Performed by: Kalyani Maeda, Sheron Nightingale Pre-anesthesia Checklist: Patient identified, Patient being monitored, Emergency Drugs available, Timeout performed and Suction available Oxygen Delivery Method: Circle system utilized Preoxygenation: Pre-oxygenation with 100% oxygen Intubation Type: IV induction LMA: LMA inserted LMA Size: 4.0 Number of attempts: 1 Placement Confirmation: breath sounds checked- equal and bilateral

## 2013-08-08 NOTE — Op Note (Signed)
PREOPERATIVE DIAGNOSIS:  48 y.o. with irregular uterine bleeding  POSTOPERATIVE DIAGNOSIS: The same  PROCEDURE: Hysteroscopy, Dilation and Curettage.  SURGEON:  Dr. Linda Hedges  INDICATIONS: 48 y.o. [phone]  here for scheduled surgery for irregular uterine bleeding and questionable polyp on ultrasound.   Risks of surgery were discussed with the patient including but not limited to: bleeding which may require transfusion; infection which may require antibiotics; injury to uterus or surrounding organs; intrauterine scarring which may impair future fertility; need for additional procedures including laparotomy or laparoscopy; and other postoperative/anesthesia complications. Written informed consent was obtained.    FINDINGS:  A 8 week size uterus.  Diffuse proliferative endometrium.  Scarring in lateral cavity obscuring ostia.  ANESTHESIA:   General  ESTIMATED BLOOD LOSS:  Less than 20 ml  SPECIMENS: Endometrial curettings sent to pathology  COMPLICATIONS:  None immediate.  PROCEDURE DETAILS:  The patient was taken to the operating room where general anesthesia was administered and was found to be adequate.  After an adequate timeout was performed, she was placed in the dorsal lithotomy position and examined; then prepped and draped in the sterile manner.   Her bladder was catheterized for an unmeasured amount of clear, yellow urine. A speculum was then placed in the patient's vagina and a single tooth tenaculum was applied to the anterior lip of the cervix. The uteus was sounded to 9 cm and dilated manually with metal dilators to accommodate the 5.5 mm diagnostic hysteroscope.  Once the cervix was dilated, the hysteroscope was inserted under direct visualization using saline as a suspension medium.  The uterine cavity was carefully examined with the above dictated findings.   After further careful visualization of the uterine cavity, the hysteroscope was removed under direct visualization.  A  sharp curettage was then performed to obtain a scant amount of endometrial curettings.  The tenaculum was removed from the anterior lip of the cervix and the vaginal speculum was removed after noting good hemostasis.  The patient tolerated the procedure well and was taken to the recovery area awake, extubated and in stable condition.

## 2013-08-08 NOTE — Anesthesia Postprocedure Evaluation (Signed)
  Anesthesia Post-op Note  Anesthesia Post Note  Patient: Adrienne Freeman  Procedure(s) Performed: Procedure(s) (LRB): DILATATION AND CURETTAGE /HYSTEROSCOPY (N/A)  Anesthesia type: General  Patient location: PACU  Post pain: Pain level controlled  Post assessment: Post-op Vital signs reviewed  Last Vitals:  Filed Vitals:   08/08/13 0830  BP: 103/67  Pulse: 68  Temp:   Resp: 18    Post vital signs: Reviewed  Level of consciousness: sedated  Complications: No apparent anesthesia complications

## 2013-08-08 NOTE — Discharge Instructions (Signed)
Call MD for T>100.4, severe abdominal pain, intractable nausea and/or vomiting, or respiratory distress.  Call to schedule postop appointment in the office in 2 weeks.      Post Anesthesia Home Care Instructions  Activity: Get plenty of rest for the remainder of the day. A responsible adult should stay with you for 24 hours following the procedure.  For the next 24 hours, DO NOT: -Drive a car -Paediatric nurse -Drink alcoholic beverages -Take any medication unless instructed by your physician -Make any legal decisions or sign important papers.  Meals: Start with liquid foods such as gelatin or soup. Progress to regular foods as tolerated. Avoid greasy, spicy, heavy foods. If nausea and/or vomiting occur, drink only clear liquids until the nausea and/or vomiting subsides. Call your physician if vomiting continues.  Special Instructions/Symptoms: Your throat may feel dry or sore from the anesthesia or the breathing tube placed in your throat during surgery. If this causes discomfort, gargle with warm salt water. The discomfort should disappear within 24 hours.

## 2013-08-11 ENCOUNTER — Encounter (HOSPITAL_COMMUNITY): Payer: Self-pay | Admitting: Obstetrics & Gynecology

## 2013-12-16 ENCOUNTER — Other Ambulatory Visit: Payer: Self-pay | Admitting: Family Medicine

## 2013-12-16 DIAGNOSIS — D352 Benign neoplasm of pituitary gland: Secondary | ICD-10-CM

## 2013-12-25 ENCOUNTER — Ambulatory Visit
Admission: RE | Admit: 2013-12-25 | Discharge: 2013-12-25 | Disposition: A | Discharge status: Home / Self Care | Payer: BC Managed Care – PPO | Source: Ambulatory Visit | Attending: Family Medicine | Admitting: Family Medicine

## 2013-12-25 DIAGNOSIS — D352 Benign neoplasm of pituitary gland: Secondary | ICD-10-CM

## 2013-12-25 MED ORDER — GADOBENATE DIMEGLUMINE 529 MG/ML IV SOLN
10.0000 mL | Freq: Once | INTRAVENOUS | Status: AC | PRN
  Administered 2013-12-25: 10 mL via INTRAVENOUS

## 2013-12-31 ENCOUNTER — Ambulatory Visit
Admission: RE | Admit: 2013-12-31 | Discharge: 2013-12-31 | Disposition: A | Discharge status: Home / Self Care | Payer: BC Managed Care – PPO | Source: Ambulatory Visit | Attending: Family Medicine | Admitting: Family Medicine

## 2013-12-31 ENCOUNTER — Other Ambulatory Visit: Payer: Self-pay | Admitting: Family Medicine

## 2013-12-31 DIAGNOSIS — C439 Malignant melanoma of skin, unspecified: Secondary | ICD-10-CM

## 2014-01-28 ENCOUNTER — Other Ambulatory Visit: Payer: Self-pay

## 2014-01-28 DIAGNOSIS — Z1231 Encounter for screening mammogram for malignant neoplasm of breast: Secondary | ICD-10-CM

## 2014-02-11 ENCOUNTER — Ambulatory Visit: Payer: BC Managed Care – PPO

## 2014-04-15 ENCOUNTER — Other Ambulatory Visit: Payer: Self-pay | Admitting: Obstetrics & Gynecology

## 2014-04-16 ENCOUNTER — Other Ambulatory Visit: Payer: Self-pay | Admitting: Obstetrics & Gynecology

## 2014-04-16 DIAGNOSIS — N6320 Unspecified lump in the left breast, unspecified quadrant: Secondary | ICD-10-CM

## 2014-04-16 LAB — CYTOLOGY - PAP

## 2014-04-22 ENCOUNTER — Ambulatory Visit: Payer: BC Managed Care – PPO

## 2014-04-23 ENCOUNTER — Other Ambulatory Visit: Payer: Self-pay | Admitting: Dermatology

## 2014-04-23 ENCOUNTER — Other Ambulatory Visit: Payer: BC Managed Care – PPO

## 2014-04-27 ENCOUNTER — Other Ambulatory Visit: Payer: BC Managed Care – PPO

## 2014-05-06 ENCOUNTER — Ambulatory Visit
Admission: RE | Admit: 2014-05-06 | Discharge: 2014-05-06 | Disposition: A | Discharge status: Home / Self Care | Payer: BC Managed Care – PPO | Source: Ambulatory Visit | Attending: Obstetrics & Gynecology | Admitting: Obstetrics & Gynecology

## 2014-05-06 DIAGNOSIS — N6320 Unspecified lump in the left breast, unspecified quadrant: Secondary | ICD-10-CM

## 2014-05-11 ENCOUNTER — Encounter (HOSPITAL_COMMUNITY): Payer: Self-pay | Admitting: Obstetrics & Gynecology

## 2014-05-12 ENCOUNTER — Other Ambulatory Visit: Payer: Self-pay | Admitting: Gastroenterology

## 2014-05-12 DIAGNOSIS — R1084 Generalized abdominal pain: Secondary | ICD-10-CM

## 2014-05-12 DIAGNOSIS — R1310 Dysphagia, unspecified: Secondary | ICD-10-CM

## 2014-05-15 ENCOUNTER — Ambulatory Visit (HOSPITAL_COMMUNITY)
Admission: RE | Admit: 2014-05-15 | Discharge: 2014-05-15 | Disposition: A | Discharge status: Home / Self Care | Payer: BC Managed Care – PPO | Source: Ambulatory Visit | Attending: Gastroenterology | Admitting: Gastroenterology

## 2014-05-15 DIAGNOSIS — R1310 Dysphagia, unspecified: Secondary | ICD-10-CM | POA: Insufficient documentation

## 2014-06-08 ENCOUNTER — Ambulatory Visit (HOSPITAL_COMMUNITY)
Admission: RE | Admit: 2014-06-08 | Discharge: 2014-06-08 | Disposition: A | Discharge status: Home / Self Care | Payer: BC Managed Care – PPO | Source: Ambulatory Visit | Attending: Gastroenterology | Admitting: Gastroenterology

## 2014-06-08 ENCOUNTER — Encounter (HOSPITAL_COMMUNITY): Payer: BC Managed Care – PPO

## 2014-06-08 DIAGNOSIS — K8020 Calculus of gallbladder without cholecystitis without obstruction: Secondary | ICD-10-CM | POA: Diagnosis not present

## 2014-06-08 DIAGNOSIS — R1084 Generalized abdominal pain: Secondary | ICD-10-CM | POA: Insufficient documentation

## 2014-06-08 DIAGNOSIS — K760 Fatty (change of) liver, not elsewhere classified: Secondary | ICD-10-CM | POA: Insufficient documentation

## 2014-12-16 ENCOUNTER — Institutional Professional Consult (permissible substitution): Payer: BLUE CROSS/BLUE SHIELD | Admitting: Neurology

## 2015-01-13 ENCOUNTER — Encounter: Payer: Self-pay | Admitting: *Deleted

## 2015-01-18 ENCOUNTER — Institutional Professional Consult (permissible substitution): Payer: BLUE CROSS/BLUE SHIELD | Admitting: Neurology

## 2015-01-18 ENCOUNTER — Telehealth: Payer: Self-pay

## 2015-01-18 NOTE — Telephone Encounter (Signed)
Pt did not show for today's appt with Dr. Brett Fairy.

## 2015-01-29 ENCOUNTER — Encounter: Payer: Self-pay | Admitting: Internal Medicine

## 2015-01-29 ENCOUNTER — Encounter: Payer: Self-pay | Admitting: Cardiovascular Disease

## 2015-02-22 ENCOUNTER — Encounter: Payer: Self-pay | Admitting: Neurology

## 2015-02-22 ENCOUNTER — Ambulatory Visit (INDEPENDENT_AMBULATORY_CARE_PROVIDER_SITE_OTHER): Payer: BLUE CROSS/BLUE SHIELD | Admitting: Neurology

## 2015-02-22 VITALS — BP 122/80 | HR 78 | Resp 20 | Ht 63.0 in | Wt 195.0 lb

## 2015-02-22 DIAGNOSIS — F4001 Agoraphobia with panic disorder: Secondary | ICD-10-CM | POA: Diagnosis not present

## 2015-02-22 DIAGNOSIS — F819 Developmental disorder of scholastic skills, unspecified: Secondary | ICD-10-CM | POA: Diagnosis not present

## 2015-02-22 DIAGNOSIS — F411 Generalized anxiety disorder: Secondary | ICD-10-CM | POA: Diagnosis not present

## 2015-02-22 DIAGNOSIS — F09 Unspecified mental disorder due to known physiological condition: Secondary | ICD-10-CM | POA: Diagnosis not present

## 2015-02-22 DIAGNOSIS — F0789 Other personality and behavioral disorders due to known physiological condition: Secondary | ICD-10-CM | POA: Diagnosis not present

## 2015-02-22 NOTE — Patient Instructions (Signed)
Mild Neurocognitive Disorder Mild neurocognitive disorder (formerly known as mild cognitive impairment) is a mental disorder. It is a slight abnormal decrease in mental function. The areas of mental function affected may include memory, thought, communication, behavior, and completion of tasks. The decrease is noticeable and measurable but for the most part does not interfere with your daily activities. Mild neurocognitive disorder typically occurs in people older than 60 years but can occur earlier. It is not as serious as major neurocognitive disorder (formerly known as dementia) but may lead to a more serious neurocognitive disorder. However, in some cases the condition does not get worse. A few people with this disorder even improve. CAUSES  There are a number of different causes of mild neurocognitive disorder:   Brain disorders associated with abnormal protein deposits, such as Alzheimer's disease, Pick's disease, and Lewy body disease.  Brain disorders associated with abnormal movement, such as Parkinson's disease and Huntington's disease.  Diseases affecting blood vessels in the brain and resulting in mini-strokes.  Certain infections, such as human immunodeficiency virus (HIV) infection.  Traumatic brain injury.  Other medical conditions such as brain tumors, underactive thyroid (hypothyroidism), and vitamin B12 deficiency.  Use of certain prescription medicine and "recreational" drugs. SYMPTOMS  Symptoms of mild neurocognitive disorder include:  Difficulty remembering. You may forget details of recent events, names, or phone numbers. You may forget important social events and appointments or repeatedly forget where you put your car keys.  Difficulty thinking and solving problems. You may have trouble with complex tasks such as paying bills or driving in unfamiliar locations.  Difficulty communicating. You may have trouble finding the right word, naming an object, forming a  sentence that makes sense, or understanding what you read or hear.  Changes in your behavior or personality. You may lose interest in the things that you used to enjoy or withdraw from social situations. You may get angry more easily than usual. You may act before thinking. You may do things in public that you would not usually do. You may hear or see things that are not real (hallucinations). You may believe falsely that others are trying to hurt you (paranoia). DIAGNOSIS Mild neurocognitive disorder is diagnosed through an assessment by your health care provider. Your health care provider will ask you and your family, friends, or coworkers questions about your symptoms. He or she will ask how often the symptoms occur, how long they have been occurring, whether they are getting worse, and the effect they are having on your life. Your health care provider may refer you to a neurologist or mental health specialist for a detailed evaluation of your mental functions (neuropsychological testing).  To identify the cause of your mild neurocognitive disorder, your health care provider may:  Obtain a detailed medical history.  Ask about alcohol and drug use, including prescription medicine.  Perform a physical exam.  Order blood tests and brain imaging exams. TREATMENT  Mild neurocognitive disorder caused by infections, use of certain medicines or "recreational" drugs, and certain medical conditions may improve with treatment of the condition that is causing the disorder. Mild neurocognitive disorder resulting from other causes generally does not improve and may worsen. In these cases, the goal of treatment is to slow progression of the disorder and help you cope with the loss of mental function. Treatments in these cases include:   Medicine. Medicine helps mainly with memory loss and behavioral symptoms.   Talk therapy. Talk therapy provides education, emotional support, memory aids, and other   include:   · Medicine. Medicine helps mainly with memory loss and behavioral symptoms.    · Talk therapy. Talk therapy provides education, emotional support, memory aids, and other ways of  making up for decreases in mental function.       · Lifestyle changes. These include regular exercise, a healthy diet (including essential omega-3 fatty acids), intellectual stimulation, and increased social interaction.  Document Released: 02/26/2013 Document Revised: 11/10/2013 Document Reviewed: 02/26/2013  [employer]® Patient Information ©2015 [employer]. This information is not intended to replace advice given to you by your health care provider. Make sure you discuss any questions you have with your health care provider.

## 2015-02-22 NOTE — Progress Notes (Signed)
Patient did show 16 minutes late for her appointment today , did no show last month.       Provider:  Larey Seat, M D  Referring Provider: Harlan Stains, MD Primary Care Physician:  Vidal Schwalbe, MD  Memory loss. Patient no showed once before  , today worked in 16 minutes late.   HPI:  Adrienne Freeman is a 49 y.o. female , seen here as a referral from Dr. Dema Severin for subjective memory loss,     She is here today with a complaint of panic attacks, anxiety and memory difficulties. This is her own chief compliant.  The patient is today referred by Dr. Caren Griffins wide, who has seen her yearly since 2013. She forwarded some several records to me including the patient's yearly blood work since 2013. It appears that Adrienne Freeman has been stable and her differential blood counts, in her Hgb A1c, she has high cholesterol which has been present for while the absolute cholesterol was 288 and 2014 and is now 298 and 2016. She also had normal metabolic panels. Ferritin have been ordered in 2013 and returned very low at 24.5 normal Phillip Heal. It is now up to 54.3 ng.  TSH has been in normal levels since 2013, just this may potassium was borderline low at 3.4 mmol. She has normal liver function tests and a free thyroid4 hormone, normal prolactin levels.  The patient had reported that she felt constantly as if her mind was racing and she has seen Dr. Reece Levy for anxiety. These with the primary concerns when she last saw Dr. Dema Severin as her primary care physician. She has been previously told that she had a learning disability also this has not been qualified or specified. She has sometimes dysesthesias also present for many years. She denies any myalgias, spasms, myoclonic jerks. After having her fibroids were removed she no longer needs oral iron she was told. Dr. Buddy Duty has followed her for hypothyroidism which seems to be controlled his medication now.  In short the patient had a generalized anxiety disorder often  with anxiety attacks or even panic attacks, at times interfering with her ability to interpret stimuli, paying attention, manifesting also as forgetfulness. I also reviewed her medication list which includes failed trials of Remeron, Prozac, Anafranil, Risperdal, BuSpar, Cymbalta, Zoloft, Paxil, Lexapro, Effexor, Wellbutrin.  My interview with the patient was followed by a Montral cognitive assessment test. This took an extraordinary amount of time, and the patient brought no family member with her. She was highly distract able and unable to answer questions in a timely manner. We had to interrupt the testing i order not to exceed a 50- 60 minute visit.  She was talking during the test , reporting how she forgot how to put on a gown, if she has to sit or rest supine on an exam table.   Forgetting how to cook, unable to follow a recipe.   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  She feels her hands and arms are heavier and she lost strength. She feels fatigued.   Social history:  Lives with husband and a 66 year old son. Her son is working full time.   Review of Systems: Out of a complete 14 system review, the patient complains of only the following symptoms, and all other reviewed systems are negative. Anxiety, panic attacks, racing thoughts. Tearful, weak, heavy , losing strength, being too weak for simple work,  inattentive .   Epworth score , Fatigue severity score, depression score were all left unanswered about several reminders !   Social History   Social History  . Marital Status: Married    Spouse Name: N/A  . Number of Children: 3  . Years of Education: 12   Occupational History  . unemployed    Social History Main Topics  . Smoking status: Former Smoker -- 0.25 packs/day for 2 years    Types: Cigarettes    Quit date: 07/10/1984  . Smokeless tobacco: Never Used  . Alcohol Use: Yes     Comment: social  . Drug  Use: No  . Sexual Activity: Yes    Birth Control/ Protection: None     Comment: husband vasectomy   Other Topics Concern  . Not on file   Social History Narrative    Family History  Problem Relation Age of Onset  . Hypertension Mother   . OCD Mother   . Heart disease Father   . Hypertension Father   . Colon polyps Father   . Pancreatic cancer      grandmother??  . Alcohol abuse Sister   . OCD Maternal Aunt     Past Medical History  Diagnosis Date  . Hypercholesterolemia   . Anemia   . Fibroids     Uterine  . Hypertension   . Thyroid disorder     Chronic lymphocytic thyroiditis.  Marland Kitchen Hypothyroidism   . Anxiety   . Anemia   . Uterine polyp   . Depression   . Cancer 01/2006    Left lower extremity malignant melanoma.  . SVD (spontaneous vaginal delivery)     x 3  . OCD (obsessive compulsive disorder)   . Sleep apnea     does not use CPAP every night  . GERD (gastroesophageal reflux disease)     no meds - diet controlled    Past Surgical History  Procedure Laterality Date  . Ablation    . Melanoma excision  2007    T1a melanoma from calf  . Wisdom tooth extraction    . Dilation and curettage of uterus    . Diagnostic laparoscopy      fibroids removed  . Hysteroscopy w/d&c N/A 08/08/2013    Procedure: DILATATION AND CURETTAGE /HYSTEROSCOPY;  Surgeon: Linda Hedges, DO;  Location: [facility];  Service: Gynecology;  Laterality: N/A;  . Doppler echocardiography  01/30/11    LVEF>70%. Mild concentric LVH. Normal LA size. mild MR. Trace TR. Normal RVSP.    Current Outpatient Prescriptions  Medication Sig Dispense Refill  . Ascorbic Acid (VITAMIN C PO) Take 1 tablet by mouth daily.    . B Complex Vitamins (VITAMIN B COMPLEX PO) Take 1 tablet by mouth daily.    . citalopram (CELEXA) 10 MG tablet Take 10 mg by mouth daily.    . ergocalciferol (VITAMIN D2) 50000 UNITS capsule Take 50,000 Units by mouth once a week.     . ferrous sulfate 325 (65 FE) MG tablet Take 325 mg  by mouth daily with breakfast.    . ibuprofen (ADVIL) 600 MG tablet Take 1 tablet (600 mg total) by mouth every 6 (six) hours as needed. 30 tablet 0  . levothyroxine (SYNTHROID, LEVOTHROID) 50 MCG tablet Take 50 mcg by mouth daily.    Marland Kitchen LORazepam (ATIVAN) 0.5 MG tablet Take 0.5 mg by mouth every 8 (eight) hours as needed for anxiety.    . Multiple Vitamin (MULTIVITAMIN WITH MINERALS) TABS Take  1 tablet by mouth daily.    . naproxen sodium (ANAPROX) 220 MG tablet Take 220 mg by mouth 2 (two) times daily with a meal.    . neomycin-polymyxin-hydrocortisone (CORTISPORIN) 3.5-10000-1 otic suspension Place 2-5 drops into both ears 4 (four) times daily.    . Omega-3-acid Ethyl Esters (LOVAZA PO) Take 2 capsules by mouth 2 (two) times daily.     Marland Kitchen omeprazole (PRILOSEC) 40 MG capsule Take 40 mg by mouth daily.    Marland Kitchen POTASSIUM CHLORIDE PO Take by mouth.    . telmisartan-hydrochlorothiazide (MICARDIS HCT) 40-12.5 MG per tablet Take 1 tablet by mouth daily.     No current facility-administered medications for this visit.    Allergies as of 02/22/2015 - Review Complete 01/13/2015  Allergen Reaction Noted  . Buspar [buspirone] Shortness Of Breath 01/13/2015  . Escitalopram oxalate Shortness Of Breath 06/22/2011  . Keflex [cephalexin] Anaphylaxis 01/13/2015  . Mepergan [meperidine-promethazine] Other (See Comments) 07/19/2011  . Remeron [mirtazapine] Anaphylaxis 01/13/2015  . Risperdal [risperidone] Anaphylaxis 01/13/2015  . Toprol xl [metoprolol succinate] Shortness Of Breath 06/22/2011  . Altace [ramipril]  01/13/2015  . Anafranil [clomipramine hcl]  01/13/2015  . Benicar hct [olmesartan medoxomil-hctz]  01/13/2015  . Bupropion Other (See Comments) 07/19/2011  . Cholestatin Other (See Comments) 06/30/2011  . Codeine Other (See Comments) 06/22/2011  . Cymbalta [duloxetine hcl]  01/13/2015  . Effexor [venlafaxine hydrochloride] Other (See Comments) 06/22/2011  . Nexium [esomeprazole magnesium]   01/13/2015  . Paroxetine hcl Other (See Comments) 06/22/2011  . Protonix [pantoprazole sodium]  01/13/2015  . Prozac [fluoxetine hcl]  01/13/2015  . Ramipril Other (See Comments) 06/22/2011  . Sertraline hcl Other (See Comments) 06/22/2011  . Toprol xl [metoprolol tartrate]  01/13/2015  . Prednisone Anxiety 01/13/2015    Vitals: There were no vitals taken for this visit. Last Weight:  Wt Readings from Last 1 Encounters:  08/06/13 222 lb (100.699 kg)   DXI:PJASN is no weight on file to calculate BMI.     Last Height:   Ht Readings from Last 1 Encounters:  08/06/13 5\' 3"  (1.6 m)    Physical exam:  General: The patient is awake, alert and appears not in acute distress. The patient is well groomed. Head: Normocephalic, atraumatic. Neck is supple. Mallampati 3    Cardiovascular:  Regular rate and rhythm, without  murmurs or carotid bruit, and without distended neck veins. Respiratory: Lungs are clear to auscultation. Skin:  Without evidence of edema, or rash Trunk: BMI is elevated . The patient's posture is erect   Neurologic exam : The patient is awake and alert, oriented to place and time.   Memory subjective described as impaired .*   MOCA: incomplete . Finalized after 25 minutes , 20 -out of 30 points , lost 3 points in recall, 1 point in attention, each  1 on date, and  Word fluency,  2 points in abstraction.   Attention span & concentration ability appears normal.  Speech is fluent,  without ysarthria, dysphonia or aphasia. She speaks very slow.  Mood and affect are appropriate. Worried . Slow.   Cranial nerves: Pupils are equal and briskly reactive to light. Funduscopic exam without  evidence of pallor or edema. Extraocular movements  in vertical and horizontal planes intact and without nystagmus. Visual fields by finger perimetry are intact. Hearing to finger rub intact.   Facial sensation intact to fine touch.  Facial motor strength is symmetric and tongue and uvula  move midline. Shoulder shrug was symmetrical.  Motor exam:   Normal tone, muscle bulk and symmetric strength in all extremities.  Sensory:  Fine touch, pinprick and vibration were tested in all extremities. Proprioception tested in the upper extremities was normal.  Coordination: Rapid alternating movements in the fingers/hands was normal. Finger-to-nose maneuver  normal without evidence of ataxia, dysmetria or tremor.  Gait and station: Patient walks without assistive device and is able unassisted to climb up to the exam table. Strength within normal limits.  Stance is stable and normal.  Toe and hell stand were tested .Tandem gait is unfragmented. Turns with 3  Steps. Romberg testing is  negative.  Deep tendon reflexes: in the  upper and lower extremities are symmetric and intact. Babinski maneuver response is downgoing.  The patient was advised of the nature of the diagnosed sleep disorder , the treatment options and risks for general a health and wellness arising from not treating the condition.  I spent more than 60 minutes of face to face time with the patient. Greater than 50% of time was spent in counseling and coordination of care. We have discussed the diagnosis and differential and I answered the patient's questions.     Assessment:  After physical and neurologic examination, review of laboratory studies,  Personal review of imaging studies, reports of other /same  Imaging studies ,  Results of polysomnography/ neurophysiology testing and pre-existing records as far as provided in visit., my assessment is   1) Adrienne Freeman reports that she often has a racing mind is extremely worried and sometimes the build up anxiety is blocking her way to sulfa problem, finish a task or perform well on a test. She reports situations such as driving on a highway when she is afraid of the many stimuli she has to coordinate the same can happen when she is shopping at [employer]. She states that the bigger as  a store the more likely she will get overwhelmed by so many stimuli. She is then not able to make the right decision of what to buy. This morning she panicked knowing that she needed somebody to drive her here because she was too nervous to do it herself. This is a hallmark of anxiety as well as ADD. Patients with ADD cannot sort through different stimuli and are distracted easily she carried the label of learning disability but I'm not sure if she was ever tested specifically for a learning disability. She cannot recall that she was actually specifically tested.  2) sometimes she will get lightheaded slightly panicked has a pressure on her chest or nausea or feels numb or tingly different body parts when ever she is panicked or very anxious. This would also be attributed to #1  3) to evaluate her baseline function a Montral cognitive assessment test may not have been the best wall. I will refer her to Dr. Raynelle Bring or the associated [facility] neuropsychologist for further testing. I'm not sure if Adrienne Freeman truly has a progressive cognitive problem or if an attention deficit disorder or anxiety disorder are preventing her as having such. I could imagine that this is a pseudodementia.     Plan:  Treatment plan and additional workup : i will refer to Dr Valentina Shaggy and will order an MRI brain and EEG.  I reviewed the MRI of the brain from 2015 and the MRI of the brain from 2013 previously both describe a small microadenoma of the pituitary gland. No other abnormalities. The images were reviewed on computer in person.  Rv  With me after neuropsychology testing. 30 minutes.  Patient was advised that her next late arrival or no show will lead to dismissal.     Larey Seat MD  02/22/2015   CC: Harlan Stains, [address] [place], [place] [address]

## 2015-02-23 ENCOUNTER — Institutional Professional Consult (permissible substitution): Payer: BLUE CROSS/BLUE SHIELD | Admitting: Neurology

## 2015-03-02 ENCOUNTER — Other Ambulatory Visit: Payer: Self-pay | Admitting: Obstetrics & Gynecology

## 2015-03-02 DIAGNOSIS — N644 Mastodynia: Secondary | ICD-10-CM

## 2015-03-04 ENCOUNTER — Ambulatory Visit
Admission: RE | Admit: 2015-03-04 | Discharge: 2015-03-04 | Disposition: A | Discharge status: Home / Self Care | Payer: BLUE CROSS/BLUE SHIELD | Source: Ambulatory Visit | Attending: Obstetrics & Gynecology | Admitting: Obstetrics & Gynecology

## 2015-03-04 DIAGNOSIS — N644 Mastodynia: Secondary | ICD-10-CM

## 2015-03-10 ENCOUNTER — Telehealth: Payer: Self-pay | Admitting: Psychology

## 2015-03-10 NOTE — Telephone Encounter (Signed)
Adrienne Freeman has called my office twice requesting to be seen sooner than 05/14/15 for neuropsychological evaluation. I spoke to her today to review the purpose of neuropsychological evaluation. I explained that her appointment was for cognitive evaluation not psychological treatment. She verbalized understanding. I offered her an earlier appointment for 04/19/15 at which time her husband could accompany her. She accepted this appointment.Marland Kitchen

## 2015-04-19 ENCOUNTER — Ambulatory Visit: Payer: BLUE CROSS/BLUE SHIELD | Attending: Psychology | Admitting: Psychology

## 2015-04-19 DIAGNOSIS — F422 Mixed obsessional thoughts and acts: Secondary | ICD-10-CM

## 2015-04-19 DIAGNOSIS — F332 Major depressive disorder, recurrent severe without psychotic features: Secondary | ICD-10-CM | POA: Diagnosis not present

## 2015-04-19 DIAGNOSIS — F411 Generalized anxiety disorder: Secondary | ICD-10-CM

## 2015-04-19 DIAGNOSIS — F410 Panic disorder [episodic paroxysmal anxiety] without agoraphobia: Secondary | ICD-10-CM
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
## 2015-05-14 ENCOUNTER — Ambulatory Visit: Payer: BLUE CROSS/BLUE SHIELD | Admitting: Psychology

## 2015-05-25 ENCOUNTER — Encounter: Payer: Self-pay | Admitting: Adult Health

## 2015-05-25 ENCOUNTER — Ambulatory Visit (INDEPENDENT_AMBULATORY_CARE_PROVIDER_SITE_OTHER): Payer: BLUE CROSS/BLUE SHIELD | Admitting: Adult Health

## 2015-05-25 VITALS — BP 128/89 | HR 96 | Ht 63.0 in | Wt 196.0 lb

## 2015-05-25 DIAGNOSIS — F09 Unspecified mental disorder due to known physiological condition: Secondary | ICD-10-CM | POA: Diagnosis not present

## 2015-05-25 DIAGNOSIS — F0789 Other personality and behavioral disorders due to known physiological condition: Secondary | ICD-10-CM | POA: Diagnosis not present

## 2015-05-25 DIAGNOSIS — F32A Depression, unspecified: Secondary | ICD-10-CM

## 2015-05-25 DIAGNOSIS — F329 Major depressive disorder, single episode, unspecified: Secondary | ICD-10-CM | POA: Diagnosis not present

## 2015-05-25 DIAGNOSIS — F411 Generalized anxiety disorder: Secondary | ICD-10-CM

## 2015-05-25 NOTE — Progress Notes (Signed)
PATIENT: Adrienne Freeman DOB: 13-Jun-1966  REASON FOR VISIT: follow up HISTORY FROM: patient  HISTORY OF PRESENT ILLNESS: [name] is a 49 year old female with a history of subjective memory loss. She returns today for review of her neuropsychological testing. Dr.Zelson completed her neuropsychological testing and found that her symptoms could be related to her ongoing psychiatric history. The patient does have significant depression and anxiety. She has been treated by Dr. Letta Moynahan in the past and more recently by Dr. Lin Landsman. She has not followed up with either psychiatrist in quite some time. She is not currently on any depression medication. In the past she has tried several medications but has been unable to tolerate them. The patient acknowledges that her depression and anxiety has increased recently. She does state that she has done very bizarre things. Such as walking down bleachers and all of a sudden sitting down on the steps instead of walking down them. She is unsure why she did this. She states another instance was at a restaurant carrying a tray to a table. She states that she had a hard time multitasking with the tray and actually walking. She is unsure if this has anything to do with her psychiatric illness. Patient continues to have memory issues and cognitive slowing. She states that her husband has noticed that as well. She states that occasionally she will see shadows in her vision. She has a hard time explaining what she means by shadows. She denies any thoughts of causing harm to herself or others. She returns today for an evaluation.  HISTORY 02/22/15: Adrienne Freeman is a 49 y.o. female , seen here as a referral from Dr. Dema Severin for subjective memory loss, She is here today with a complaint of panic attacks, anxiety and memory difficulties. This is her own chief compliant.  The patient is today referred by Dr. Caren Griffins wide, who has seen her yearly since 2013. She forwarded  some several records to me including the patient's yearly blood work since 2013. It appears that Mrs. Ceasar Lund has been stable and her differential blood counts, in her Hgb A1c, she has high cholesterol which has been present for while the absolute cholesterol was 288 and 2014 and is now 298 and 2016. She also had normal metabolic panels. Ferritin have been ordered in 2013 and returned very low at 24.5 normal Phillip Heal. It is now up to 54.3 ng.  TSH has been in normal levels since 2013, just this may potassium was borderline low at 3.4 mmol. She has normal liver function tests and a free thyroid4 hormone, normal prolactin levels. The patient had reported that she felt constantly as if her mind was racing and she has seen Dr. Reece Levy for anxiety. These with the primary concerns when she last saw Dr. Dema Severin as her primary care physician. She has been previously told that she had a learning disability also this has not been qualified or specified. She has sometimes dysesthesias also present for many years. She denies any myalgias, spasms, myoclonic jerks. After having her fibroids were removed she no longer needs oral iron she was told. Dr. Buddy Duty has followed her for hypothyroidism which seems to be controlled his medication now.  In short the patient had a generalized anxiety disorder often with anxiety attacks or even panic attacks, at times interfering with her ability to interpret stimuli, paying attention, manifesting also as forgetfulness. I also reviewed her medication list which includes failed trials of Remeron, Prozac, Anafranil, Risperdal, BuSpar, Cymbalta,  Zoloft, Paxil, Lexapro, Effexor, Wellbutrin. My interview with the patient was followed by a [facility] cognitive assessment test. This took an extraordinary amount of time, and the patient brought no family member with her. She was highly distract able and unable to answer questions in a timely manner. We had to interrupt the testing i order not to exceed a  50- 60 minute visit.  She was talking during the test , reporting how she forgot how to put on a gown, if she has to sit or rest supine on an exam table.  Forgetting how to cook, unable to follow a recipe.  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  She feels her hands and arms are heavier and she lost strength. She feels fatigued.  Social history: Lives with husband and a 16 year old son. Her son is working full time  REVIEW OF SYSTEMS: Out of a complete 14 system review of symptoms, the patient complains only of the following symptoms, and all other reviewed systems are negative.  Eye itching, excessive thirst, palpitations, apnea, frequent waking, joint pain, muscle cramps, memory loss, agitation, decreased concentration, depression, anxious    ALLERGIES: Allergies  Allergen Reactions  . Buspar [Buspirone] Shortness Of Breath  . Escitalopram Oxalate Shortness Of Breath  . Keflex [Cephalexin] Anaphylaxis    headache  . Mepergan [Meperidine-Promethazine] Other (See Comments)    Causes  Chest  Pain.  . Remeron [Mirtazapine] Anaphylaxis  . Risperdal [Risperidone] Anaphylaxis  . Toprol Xl [Metoprolol Succinate] Shortness Of Breath  . Altace [Ramipril]   . Anafranil [Clomipramine Hcl]     Jerking body movements  . Benicar Hct [Olmesartan Medoxomil-Hctz]     Memory difficulties   . Bupropion Other (See Comments)    Reaction unknown  . Cholestatin Other (See Comments)    Reaction unknown  . Codeine Other (See Comments)    Patient states that doctor told her that she was allergic to this medication.  Marland Kitchen Cymbalta [Duloxetine Hcl]     Worsening depression   . Effexor [Venlafaxine Hydrochloride] Other (See Comments)    Patient states that doctor told her that she was allergic to this medication.  Marland Kitchen Nexium [Esomeprazole Magnesium]     Sensation of throat closing   . Paroxetine Hcl Other (See Comments)    Patient  states that doctor told her that she was allergic to this medication.  . Protonix [Pantoprazole Sodium]   . Prozac [Fluoxetine Hcl]     Anger/anxiety  . Ramipril Other (See Comments)    Patient states that doctor told her that she was allergic to this medication.  . Sertraline Hcl Other (See Comments)    Patient states that this medication makes her OCD worse.  . Toprol Xl [Metoprolol Tartrate]     Can't recall   . Prednisone Anxiety    HOME MEDICATIONS: Outpatient Prescriptions Prior to Visit  Medication Sig Dispense Refill  . Ascorbic Acid (VITAMIN C PO) Take 1 tablet by mouth daily.    . B Complex Vitamins (VITAMIN B COMPLEX PO) Take 1 tablet by mouth daily.    . citalopram (CELEXA) 10 MG tablet Take 10 mg by mouth daily.    . ergocalciferol (VITAMIN D2) 50000 UNITS capsule Take 50,000 Units by mouth once a week.     . ferrous sulfate 325 (65 FE) MG tablet Take 325 mg by mouth daily as needed.     Marland Kitchen ibuprofen (ADVIL) 600 MG  tablet Take 1 tablet (600 mg total) by mouth every 6 (six) hours as needed. 30 tablet 0  . levothyroxine (SYNTHROID, LEVOTHROID) 50 MCG tablet Take 50 mcg by mouth daily.    Marland Kitchen LORazepam (ATIVAN) 0.5 MG tablet Take 0.5 mg by mouth every 8 (eight) hours as needed for anxiety.    . Multiple Vitamin (MULTIVITAMIN WITH MINERALS) TABS Take 1 tablet by mouth daily.    Marland Kitchen neomycin-polymyxin-hydrocortisone (CORTISPORIN) 3.5-10000-1 otic suspension Place 2-5 drops into both ears 4 (four) times daily.    . Omega-3-acid Ethyl Esters (LOVAZA PO) Take 2 capsules by mouth 2 (two) times daily.     Marland Kitchen POTASSIUM CHLORIDE PO Take by mouth.    . telmisartan-hydrochlorothiazide (MICARDIS HCT) 40-12.5 MG per tablet Take 1 tablet by mouth daily.    . naproxen sodium (ANAPROX) 220 MG tablet Take 220 mg by mouth as needed.     Marland Kitchen omeprazole (PRILOSEC) 40 MG capsule Take 40 mg by mouth daily.     No facility-administered medications prior to visit.    PAST MEDICAL HISTORY: Past Medical  History  Diagnosis Date  . Hypercholesterolemia   . Anemia   . Fibroids     Uterine  . Hypertension   . Thyroid disorder     Chronic lymphocytic thyroiditis.  Marland Kitchen Hypothyroidism   . Anxiety   . Anemia   . Uterine polyp   . Depression   . Cancer [facility]) 01/2006    Left lower extremity malignant melanoma.  . SVD (spontaneous vaginal delivery)     x 3  . OCD (obsessive compulsive disorder)   . Sleep apnea     does not use CPAP every night  . GERD (gastroesophageal reflux disease)     no meds - diet controlled    PAST SURGICAL HISTORY: Past Surgical History  Procedure Laterality Date  . Ablation    . Melanoma excision  2007    T1a melanoma from calf  . Wisdom tooth extraction    . Dilation and curettage of uterus    . Diagnostic laparoscopy      fibroids removed  . Hysteroscopy w/d&c N/A 08/08/2013    Procedure: DILATATION AND CURETTAGE /HYSTEROSCOPY;  Surgeon: Linda Hedges, DO;  Location: [facility];  Service: Gynecology;  Laterality: N/A;  . Doppler echocardiography  01/30/11    LVEF>70%. Mild concentric LVH. Normal LA size. mild MR. Trace TR. Normal RVSP.    FAMILY HISTORY: Family History  Problem Relation Age of Onset  . Hypertension Mother   . OCD Mother   . Heart disease Father   . Hypertension Father   . Colon polyps Father   . Pancreatic cancer      grandmother??  . Alcohol abuse Sister   . OCD Maternal Aunt     SOCIAL HISTORY: Social History   Social History  . Marital Status: Married    Spouse Name: N/A  . Number of Children: 3  . Years of Education: 12   Occupational History  . unemployed    Social History Main Topics  . Smoking status: Former Smoker -- 0.25 packs/day for 2 years    Types: Cigarettes    Quit date: 07/10/1984  . Smokeless tobacco: Never Used  . Alcohol Use: Yes     Comment: social  . Drug Use: No  . Sexual Activity: Yes    Birth Control/ Protection: None     Comment: husband vasectomy   Other Topics Concern  . Not on file  Social History Narrative      PHYSICAL EXAM  Filed Vitals:   05/25/15 1257  BP: 128/89  Pulse: 96  Height: 5\' 3"  (1.6 m)  Weight: 196 lb (88.905 kg)   Body mass index is 34.73 kg/(m^2).  Generalized: Well developed, in no acute distress   Neurological examination  Mentation: Alert oriented to time, place, history taking. Follows all commands speech and language fluent but very slow. Cranial nerve II-XII: Pupils were equal round reactive to light. Extraocular movements were full, visual field were full on confrontational test. Facial sensation and strength were normal. Uvula tongue midline. Head turning and shoulder shrug  were normal and symmetric. Motor: The motor testing reveals 5 over 5 strength of all 4 extremities. Good symmetric motor tone is noted throughout.  Sensory: Sensory testing is intact to soft touch on all 4 extremities. No evidence of extinction is noted.  Coordination: Cerebellar testing reveals good finger-nose-finger and heel-to-shin bilaterally.  Gait and station: Gait is normal. Tandem gait is normal. Romberg is negative. No drift is seen.  Reflexes: Deep tendon reflexes are symmetric and normal bilaterally.   DIAGNOSTIC DATA (LABS, IMAGING, TESTING) - I reviewed patient records, labs, notes, testing and imaging myself where available.  Lab Results  Component Value Date   WBC 8.4 08/06/2013   HGB 13.9 08/06/2013   HCT 39.7 08/06/2013   MCV 83.6 08/06/2013   PLT 228 08/06/2013      Component Value Date/Time   NA 139 08/06/2013 1057   K 3.7 08/06/2013 1057   CL 99 08/06/2013 1057   CO2 30 08/06/2013 1057   GLUCOSE 83 08/06/2013 1057   BUN 8 08/06/2013 1057   CREATININE 0.73 08/06/2013 1057   CALCIUM 9.2 08/06/2013 1057   PROT 6.6 07/21/2011 1252   ALBUMIN 4.1 07/21/2011 1252   AST 19 07/21/2011 1252   ALT 24 07/21/2011 1252   ALKPHOS 45 07/21/2011 1252   BILITOT 0.2* 07/21/2011 1252   GFRNONAA >90 08/06/2013 1057   GFRAA >90 08/06/2013  1057      ASSESSMENT AND PLAN 49 y.o. year old female  has a past medical history of Hypercholesterolemia; Anemia; Fibroids; Hypertension; Thyroid disorder; Hypothyroidism; Anxiety; Anemia; Uterine polyp; Depression; Cancer [facility]) (01/2006); SVD (spontaneous vaginal delivery); OCD (obsessive compulsive disorder); Sleep apnea; and GERD (gastroesophageal reflux disease). here with:  1. Depression 2. Anxiety  3. Subjective memory loss  The patient's neuropsychological testing indicates that her ongoing psychiatric history of depression and anxiety may be the cause of her memory loss. Dr. Valentina Shaggy  recommended that she reestablish care with her psychiatrist. I agree with this plan. I have encouraged the patient to set up an appointment with either Dr. Caprice Beaver or Dr. Lin Landsman. Once her depression and anxiety is adequate treated neuropsychological testing may be repeated in the future. Patient verbalized understanding. She will follow up with our office on an as-needed basis.    Ward Givens, MSN, NP-C 05/25/2015, 1:18 PM [facility] [address], [place], [place] [address] [phone]

## 2015-05-25 NOTE — Patient Instructions (Signed)
Follow-up with your psychiatrist in regards to depression and anxiety. If your symptoms worsen or you develop new symptoms please let us know.

## 2015-05-25 NOTE — Progress Notes (Signed)
I agree with the assessment and plan as directed by NP .The patient is known to me .   Cooper Moroney, MD  

## 2015-12-02 ENCOUNTER — Other Ambulatory Visit: Payer: Self-pay | Admitting: Family Medicine

## 2015-12-02 DIAGNOSIS — N6320 Unspecified lump in the left breast, unspecified quadrant: Principal | ICD-10-CM

## 2015-12-02 DIAGNOSIS — N6325 Unspecified lump in the left breast, overlapping quadrants: Secondary | ICD-10-CM

## 2015-12-03 ENCOUNTER — Other Ambulatory Visit: Payer: Self-pay | Admitting: Family Medicine

## 2015-12-03 DIAGNOSIS — M7989 Other specified soft tissue disorders: Secondary | ICD-10-CM

## 2015-12-15 ENCOUNTER — Other Ambulatory Visit: Payer: BLUE CROSS/BLUE SHIELD

## 2015-12-15 ENCOUNTER — Ambulatory Visit
Admission: RE | Admit: 2015-12-15 | Discharge: 2015-12-15 | Disposition: A | Discharge status: Home / Self Care | Payer: BLUE CROSS/BLUE SHIELD | Source: Ambulatory Visit | Attending: Family Medicine | Admitting: Family Medicine

## 2015-12-15 DIAGNOSIS — N6320 Unspecified lump in the left breast, unspecified quadrant: Principal | ICD-10-CM

## 2015-12-15 DIAGNOSIS — N6325 Unspecified lump in the left breast, overlapping quadrants: Secondary | ICD-10-CM

## 2015-12-15 DIAGNOSIS — M7989 Other specified soft tissue disorders: Secondary | ICD-10-CM

## 2017-04-04 ENCOUNTER — Other Ambulatory Visit: Payer: Self-pay | Admitting: Family Medicine

## 2017-04-04 DIAGNOSIS — N63 Unspecified lump in unspecified breast: Secondary | ICD-10-CM

## 2017-04-05 ENCOUNTER — Other Ambulatory Visit: Payer: Self-pay | Admitting: Family Medicine

## 2017-04-05 DIAGNOSIS — Z1231 Encounter for screening mammogram for malignant neoplasm of breast: Secondary | ICD-10-CM

## 2017-04-18 ENCOUNTER — Other Ambulatory Visit: Payer: Self-pay | Admitting: Family Medicine

## 2017-04-18 DIAGNOSIS — N6320 Unspecified lump in the left breast, unspecified quadrant: Secondary | ICD-10-CM

## 2017-04-23 ENCOUNTER — Ambulatory Visit
Admission: RE | Admit: 2017-04-23 | Discharge: 2017-04-23 | Disposition: A | Discharge status: Home / Self Care | Payer: BLUE CROSS/BLUE SHIELD | Source: Ambulatory Visit | Attending: Family Medicine | Admitting: Family Medicine

## 2017-04-23 DIAGNOSIS — N63 Unspecified lump in unspecified breast: Secondary | ICD-10-CM

## 2017-04-23 DIAGNOSIS — N6320 Unspecified lump in the left breast, unspecified quadrant: Secondary | ICD-10-CM

## 2017-08-29 DIAGNOSIS — F411 Generalized anxiety disorder: Secondary | ICD-10-CM | POA: Diagnosis not present

## 2017-08-29 DIAGNOSIS — F331 Major depressive disorder, recurrent, moderate: Secondary | ICD-10-CM | POA: Diagnosis not present

## 2017-09-11 ENCOUNTER — Other Ambulatory Visit: Payer: Self-pay | Admitting: Family Medicine

## 2017-09-11 ENCOUNTER — Other Ambulatory Visit (HOSPITAL_COMMUNITY)
Admission: RE | Admit: 2017-09-11 | Discharge: 2017-09-11 | Disposition: A | Discharge status: Home / Self Care | Payer: BLUE CROSS/BLUE SHIELD | Source: Ambulatory Visit | Attending: Family Medicine | Admitting: Family Medicine

## 2017-09-11 DIAGNOSIS — E039 Hypothyroidism, unspecified: Secondary | ICD-10-CM | POA: Diagnosis not present

## 2017-09-11 DIAGNOSIS — E559 Vitamin D deficiency, unspecified: Secondary | ICD-10-CM | POA: Diagnosis not present

## 2017-09-11 DIAGNOSIS — I10 Essential (primary) hypertension: Secondary | ICD-10-CM | POA: Diagnosis not present

## 2017-09-11 DIAGNOSIS — Z124 Encounter for screening for malignant neoplasm of cervix: Secondary | ICD-10-CM | POA: Insufficient documentation

## 2017-09-11 DIAGNOSIS — L299 Pruritus, unspecified: Secondary | ICD-10-CM | POA: Diagnosis not present

## 2017-09-11 DIAGNOSIS — Z0000 Encounter for general adult medical examination without abnormal findings: Secondary | ICD-10-CM | POA: Diagnosis not present

## 2017-09-11 DIAGNOSIS — E785 Hyperlipidemia, unspecified: Secondary | ICD-10-CM | POA: Diagnosis not present

## 2017-09-11 DIAGNOSIS — D352 Benign neoplasm of pituitary gland: Secondary | ICD-10-CM | POA: Diagnosis not present

## 2017-09-12 ENCOUNTER — Other Ambulatory Visit: Payer: Self-pay | Admitting: Family Medicine

## 2017-09-12 DIAGNOSIS — D492 Neoplasm of unspecified behavior of bone, soft tissue, and skin: Secondary | ICD-10-CM

## 2017-09-12 LAB — CYTOLOGY - PAP: DIAGNOSIS: NEGATIVE

## 2017-09-18 ENCOUNTER — Other Ambulatory Visit: Payer: Self-pay | Admitting: Family Medicine

## 2017-09-18 ENCOUNTER — Ambulatory Visit
Admission: RE | Admit: 2017-09-18 | Discharge: 2017-09-18 | Disposition: A | Discharge status: Home / Self Care | Payer: BLUE CROSS/BLUE SHIELD | Source: Ambulatory Visit | Attending: Family Medicine | Admitting: Family Medicine

## 2017-09-18 DIAGNOSIS — R2241 Localized swelling, mass and lump, right lower limb: Secondary | ICD-10-CM | POA: Diagnosis not present

## 2017-09-18 DIAGNOSIS — R2242 Localized swelling, mass and lump, left lower limb: Secondary | ICD-10-CM | POA: Diagnosis not present

## 2017-09-18 DIAGNOSIS — D492 Neoplasm of unspecified behavior of bone, soft tissue, and skin: Secondary | ICD-10-CM

## 2017-10-24 ENCOUNTER — Other Ambulatory Visit: Payer: Self-pay | Admitting: Family Medicine

## 2017-10-24 DIAGNOSIS — M7989 Other specified soft tissue disorders: Secondary | ICD-10-CM

## 2017-10-31 DIAGNOSIS — Z01818 Encounter for other preprocedural examination: Secondary | ICD-10-CM | POA: Diagnosis not present

## 2017-10-31 DIAGNOSIS — Z1211 Encounter for screening for malignant neoplasm of colon: Secondary | ICD-10-CM | POA: Diagnosis not present

## 2017-11-02 ENCOUNTER — Other Ambulatory Visit: Payer: BLUE CROSS/BLUE SHIELD

## 2017-11-05 ENCOUNTER — Ambulatory Visit
Admission: RE | Admit: 2017-11-05 | Discharge: 2017-11-05 | Disposition: A | Discharge status: Home / Self Care | Payer: BLUE CROSS/BLUE SHIELD | Source: Ambulatory Visit | Attending: Family Medicine | Admitting: Family Medicine

## 2017-11-05 DIAGNOSIS — R2242 Localized swelling, mass and lump, left lower limb: Secondary | ICD-10-CM | POA: Diagnosis not present

## 2017-11-05 DIAGNOSIS — M7989 Other specified soft tissue disorders: Secondary | ICD-10-CM

## 2017-11-05 MED ORDER — GADOBENATE DIMEGLUMINE 529 MG/ML IV SOLN
18.0000 mL | Freq: Once | INTRAVENOUS | Status: AC | PRN
  Administered 2017-11-05: 18 mL via INTRAVENOUS

## 2017-11-08 DIAGNOSIS — K621 Rectal polyp: Secondary | ICD-10-CM | POA: Diagnosis not present

## 2017-11-08 DIAGNOSIS — D126 Benign neoplasm of colon, unspecified: Secondary | ICD-10-CM | POA: Diagnosis not present

## 2017-11-08 DIAGNOSIS — Z1211 Encounter for screening for malignant neoplasm of colon: Secondary | ICD-10-CM | POA: Diagnosis not present

## 2017-11-10 ENCOUNTER — Other Ambulatory Visit: Payer: BLUE CROSS/BLUE SHIELD

## 2017-11-13 DIAGNOSIS — Z1211 Encounter for screening for malignant neoplasm of colon: Secondary | ICD-10-CM | POA: Diagnosis not present

## 2017-11-13 DIAGNOSIS — D126 Benign neoplasm of colon, unspecified: Secondary | ICD-10-CM | POA: Diagnosis not present

## 2017-11-13 DIAGNOSIS — K621 Rectal polyp: Secondary | ICD-10-CM | POA: Diagnosis not present

## 2017-12-08 ENCOUNTER — Other Ambulatory Visit: Payer: BLUE CROSS/BLUE SHIELD

## 2017-12-12 ENCOUNTER — Ambulatory Visit
Admission: RE | Admit: 2017-12-12 | Discharge: 2017-12-12 | Disposition: A | Discharge status: Home / Self Care | Payer: BLUE CROSS/BLUE SHIELD | Source: Ambulatory Visit | Attending: Family Medicine | Admitting: Family Medicine

## 2017-12-12 ENCOUNTER — Other Ambulatory Visit: Payer: Self-pay | Admitting: Family Medicine

## 2017-12-12 DIAGNOSIS — R2232 Localized swelling, mass and lump, left upper limb: Secondary | ICD-10-CM | POA: Diagnosis not present

## 2017-12-12 DIAGNOSIS — M7989 Other specified soft tissue disorders: Secondary | ICD-10-CM

## 2017-12-12 MED ORDER — GADOBENATE DIMEGLUMINE 529 MG/ML IV SOLN
18.0000 mL | Freq: Once | INTRAVENOUS | Status: AC | PRN
  Administered 2017-12-12: 18 mL via INTRAVENOUS

## 2018-01-18 DIAGNOSIS — E785 Hyperlipidemia, unspecified: Secondary | ICD-10-CM | POA: Diagnosis not present

## 2018-01-18 DIAGNOSIS — Z79899 Other long term (current) drug therapy: Secondary | ICD-10-CM | POA: Diagnosis not present

## 2018-03-07 DIAGNOSIS — Z86018 Personal history of other benign neoplasm: Secondary | ICD-10-CM | POA: Diagnosis not present

## 2018-03-07 DIAGNOSIS — Z85820 Personal history of malignant melanoma of skin: Secondary | ICD-10-CM | POA: Diagnosis not present

## 2018-03-07 DIAGNOSIS — Z808 Family history of malignant neoplasm of other organs or systems: Secondary | ICD-10-CM | POA: Diagnosis not present

## 2018-03-07 DIAGNOSIS — L814 Other melanin hyperpigmentation: Secondary | ICD-10-CM | POA: Diagnosis not present

## 2018-03-25 DIAGNOSIS — F411 Generalized anxiety disorder: Secondary | ICD-10-CM | POA: Diagnosis not present

## 2018-03-25 DIAGNOSIS — F332 Major depressive disorder, recurrent severe without psychotic features: Secondary | ICD-10-CM | POA: Diagnosis not present

## 2018-03-25 DIAGNOSIS — F4310 Post-traumatic stress disorder, unspecified: Secondary | ICD-10-CM | POA: Diagnosis not present

## 2018-04-15 ENCOUNTER — Other Ambulatory Visit: Payer: Self-pay | Admitting: Family Medicine

## 2018-04-15 DIAGNOSIS — Z1231 Encounter for screening mammogram for malignant neoplasm of breast: Secondary | ICD-10-CM

## 2018-05-14 ENCOUNTER — Ambulatory Visit: Payer: BLUE CROSS/BLUE SHIELD

## 2018-05-20 DIAGNOSIS — E785 Hyperlipidemia, unspecified: Secondary | ICD-10-CM | POA: Diagnosis not present

## 2018-05-20 DIAGNOSIS — I10 Essential (primary) hypertension: Secondary | ICD-10-CM | POA: Diagnosis not present

## 2018-05-20 DIAGNOSIS — E039 Hypothyroidism, unspecified: Secondary | ICD-10-CM | POA: Diagnosis not present

## 2018-05-20 DIAGNOSIS — Z23 Encounter for immunization: Secondary | ICD-10-CM | POA: Diagnosis not present

## 2018-06-13 ENCOUNTER — Ambulatory Visit
Admission: RE | Admit: 2018-06-13 | Discharge: 2018-06-13 | Disposition: A | Discharge status: Home / Self Care | Payer: BLUE CROSS/BLUE SHIELD | Source: Ambulatory Visit | Attending: Family Medicine | Admitting: Family Medicine

## 2018-06-13 DIAGNOSIS — Z1231 Encounter for screening mammogram for malignant neoplasm of breast: Secondary | ICD-10-CM | POA: Diagnosis not present

## 2018-06-24 ENCOUNTER — Ambulatory Visit: Payer: BLUE CROSS/BLUE SHIELD

## 2018-09-03 DIAGNOSIS — F332 Major depressive disorder, recurrent severe without psychotic features: Secondary | ICD-10-CM | POA: Diagnosis not present

## 2018-09-03 DIAGNOSIS — F411 Generalized anxiety disorder: Secondary | ICD-10-CM | POA: Diagnosis not present

## 2018-09-03 DIAGNOSIS — F4310 Post-traumatic stress disorder, unspecified: Secondary | ICD-10-CM | POA: Diagnosis not present

## 2018-11-01 DIAGNOSIS — E785 Hyperlipidemia, unspecified: Secondary | ICD-10-CM | POA: Diagnosis not present

## 2018-11-01 DIAGNOSIS — Z0000 Encounter for general adult medical examination without abnormal findings: Secondary | ICD-10-CM | POA: Diagnosis not present

## 2018-11-01 DIAGNOSIS — E039 Hypothyroidism, unspecified: Secondary | ICD-10-CM | POA: Diagnosis not present

## 2018-11-01 DIAGNOSIS — F411 Generalized anxiety disorder: Secondary | ICD-10-CM | POA: Diagnosis not present

## 2018-11-01 DIAGNOSIS — I10 Essential (primary) hypertension: Secondary | ICD-10-CM | POA: Diagnosis not present

## 2019-04-24 DIAGNOSIS — L814 Other melanin hyperpigmentation: Secondary | ICD-10-CM | POA: Diagnosis not present

## 2019-04-24 DIAGNOSIS — L820 Inflamed seborrheic keratosis: Secondary | ICD-10-CM | POA: Diagnosis not present

## 2019-04-24 DIAGNOSIS — Z86018 Personal history of other benign neoplasm: Secondary | ICD-10-CM | POA: Diagnosis not present

## 2019-04-24 DIAGNOSIS — Z808 Family history of malignant neoplasm of other organs or systems: Secondary | ICD-10-CM | POA: Diagnosis not present

## 2019-04-24 DIAGNOSIS — Z85820 Personal history of malignant melanoma of skin: Secondary | ICD-10-CM | POA: Diagnosis not present

## 2019-04-24 DIAGNOSIS — D485 Neoplasm of uncertain behavior of skin: Secondary | ICD-10-CM | POA: Diagnosis not present

## 2019-11-27 ENCOUNTER — Other Ambulatory Visit: Payer: Self-pay | Admitting: Family Medicine

## 2019-11-27 ENCOUNTER — Other Ambulatory Visit (HOSPITAL_COMMUNITY)
Admission: RE | Admit: 2019-11-27 | Discharge: 2019-11-27 | Disposition: A | Discharge status: Home / Self Care | Payer: BC Managed Care – PPO | Source: Ambulatory Visit | Attending: Family Medicine | Admitting: Family Medicine

## 2019-11-27 DIAGNOSIS — I10 Essential (primary) hypertension: Secondary | ICD-10-CM | POA: Diagnosis not present

## 2019-11-27 DIAGNOSIS — E559 Vitamin D deficiency, unspecified: Secondary | ICD-10-CM | POA: Diagnosis not present

## 2019-11-27 DIAGNOSIS — Z124 Encounter for screening for malignant neoplasm of cervix: Secondary | ICD-10-CM | POA: Diagnosis not present

## 2019-11-27 DIAGNOSIS — E785 Hyperlipidemia, unspecified: Secondary | ICD-10-CM | POA: Diagnosis not present

## 2019-11-27 DIAGNOSIS — N952 Postmenopausal atrophic vaginitis: Secondary | ICD-10-CM | POA: Diagnosis not present

## 2019-11-27 DIAGNOSIS — Z0000 Encounter for general adult medical examination without abnormal findings: Secondary | ICD-10-CM | POA: Diagnosis not present

## 2019-11-27 DIAGNOSIS — E039 Hypothyroidism, unspecified: Secondary | ICD-10-CM | POA: Diagnosis not present

## 2019-11-28 LAB — CYTOLOGY - PAP
Comment: NEGATIVE
Diagnosis: NEGATIVE
High risk HPV: NEGATIVE

## 2020-01-02 DIAGNOSIS — F4000 Agoraphobia, unspecified: Secondary | ICD-10-CM | POA: Diagnosis not present

## 2020-01-02 DIAGNOSIS — F332 Major depressive disorder, recurrent severe without psychotic features: Secondary | ICD-10-CM | POA: Diagnosis not present

## 2020-01-22 DIAGNOSIS — F332 Major depressive disorder, recurrent severe without psychotic features: Secondary | ICD-10-CM | POA: Diagnosis not present

## 2020-01-22 DIAGNOSIS — F4000 Agoraphobia, unspecified: Secondary | ICD-10-CM | POA: Diagnosis not present

## 2020-01-23 DIAGNOSIS — Z23 Encounter for immunization: Secondary | ICD-10-CM | POA: Diagnosis not present

## 2020-02-02 DIAGNOSIS — F332 Major depressive disorder, recurrent severe without psychotic features: Secondary | ICD-10-CM | POA: Diagnosis not present

## 2020-02-02 DIAGNOSIS — F4000 Agoraphobia, unspecified: Secondary | ICD-10-CM | POA: Diagnosis not present

## 2020-02-06 ENCOUNTER — Other Ambulatory Visit: Payer: Self-pay | Admitting: Family Medicine

## 2020-02-06 DIAGNOSIS — Z1231 Encounter for screening mammogram for malignant neoplasm of breast: Secondary | ICD-10-CM

## 2020-02-16 ENCOUNTER — Other Ambulatory Visit: Payer: Self-pay

## 2020-02-16 ENCOUNTER — Ambulatory Visit
Admission: RE | Admit: 2020-02-16 | Discharge: 2020-02-16 | Disposition: A | Discharge status: Home / Self Care | Payer: BC Managed Care – PPO | Source: Ambulatory Visit | Attending: Family Medicine | Admitting: Family Medicine

## 2020-02-16 DIAGNOSIS — Z1231 Encounter for screening mammogram for malignant neoplasm of breast: Secondary | ICD-10-CM

## 2020-03-02 DIAGNOSIS — F332 Major depressive disorder, recurrent severe without psychotic features: Secondary | ICD-10-CM | POA: Diagnosis not present

## 2020-03-18 DIAGNOSIS — F4000 Agoraphobia, unspecified: Secondary | ICD-10-CM | POA: Diagnosis not present

## 2020-03-18 DIAGNOSIS — F332 Major depressive disorder, recurrent severe without psychotic features: Secondary | ICD-10-CM | POA: Diagnosis not present

## 2020-04-08 DIAGNOSIS — F332 Major depressive disorder, recurrent severe without psychotic features: Secondary | ICD-10-CM | POA: Diagnosis not present

## 2020-04-08 DIAGNOSIS — F4000 Agoraphobia, unspecified: Secondary | ICD-10-CM | POA: Diagnosis not present

## 2020-04-09 DIAGNOSIS — F419 Anxiety disorder, unspecified: Secondary | ICD-10-CM | POA: Diagnosis not present

## 2020-04-09 DIAGNOSIS — R252 Cramp and spasm: Secondary | ICD-10-CM | POA: Diagnosis not present

## 2020-04-09 DIAGNOSIS — G4733 Obstructive sleep apnea (adult) (pediatric): Secondary | ICD-10-CM | POA: Diagnosis not present

## 2020-04-09 DIAGNOSIS — R001 Bradycardia, unspecified: Secondary | ICD-10-CM | POA: Diagnosis not present

## 2020-04-09 DIAGNOSIS — R079 Chest pain, unspecified: Secondary | ICD-10-CM | POA: Diagnosis not present

## 2020-04-09 DIAGNOSIS — E785 Hyperlipidemia, unspecified: Secondary | ICD-10-CM | POA: Diagnosis not present

## 2020-04-23 DIAGNOSIS — F4000 Agoraphobia, unspecified: Secondary | ICD-10-CM | POA: Diagnosis not present

## 2020-04-23 DIAGNOSIS — F332 Major depressive disorder, recurrent severe without psychotic features: Secondary | ICD-10-CM | POA: Diagnosis not present

## 2020-04-27 DIAGNOSIS — F4000 Agoraphobia, unspecified: Secondary | ICD-10-CM | POA: Diagnosis not present

## 2020-04-27 DIAGNOSIS — F332 Major depressive disorder, recurrent severe without psychotic features: Secondary | ICD-10-CM | POA: Diagnosis not present

## 2020-04-29 DIAGNOSIS — L821 Other seborrheic keratosis: Secondary | ICD-10-CM | POA: Diagnosis not present

## 2020-04-29 DIAGNOSIS — D225 Melanocytic nevi of trunk: Secondary | ICD-10-CM | POA: Diagnosis not present

## 2020-04-29 DIAGNOSIS — L578 Other skin changes due to chronic exposure to nonionizing radiation: Secondary | ICD-10-CM | POA: Diagnosis not present

## 2020-04-29 DIAGNOSIS — L281 Prurigo nodularis: Secondary | ICD-10-CM | POA: Diagnosis not present

## 2020-05-11 ENCOUNTER — Ambulatory Visit: Payer: BC Managed Care – PPO | Admitting: Cardiovascular Disease

## 2020-05-18 ENCOUNTER — Ambulatory Visit: Payer: BC Managed Care – PPO | Admitting: Cardiovascular Disease

## 2020-05-18 DIAGNOSIS — F4000 Agoraphobia, unspecified: Secondary | ICD-10-CM | POA: Diagnosis not present

## 2020-05-18 DIAGNOSIS — F332 Major depressive disorder, recurrent severe without psychotic features: Secondary | ICD-10-CM | POA: Diagnosis not present

## 2020-05-24 DIAGNOSIS — E039 Hypothyroidism, unspecified: Secondary | ICD-10-CM | POA: Diagnosis not present

## 2020-05-24 DIAGNOSIS — I10 Essential (primary) hypertension: Secondary | ICD-10-CM | POA: Diagnosis not present

## 2020-05-24 DIAGNOSIS — E785 Hyperlipidemia, unspecified: Secondary | ICD-10-CM | POA: Diagnosis not present

## 2020-05-24 DIAGNOSIS — Z7189 Other specified counseling: Secondary | ICD-10-CM | POA: Diagnosis not present

## 2020-05-24 DIAGNOSIS — Z23 Encounter for immunization: Secondary | ICD-10-CM | POA: Diagnosis not present

## 2020-05-25 DIAGNOSIS — G4733 Obstructive sleep apnea (adult) (pediatric): Secondary | ICD-10-CM | POA: Diagnosis not present

## 2020-06-02 ENCOUNTER — Telehealth: Payer: Self-pay | Admitting: Radiology

## 2020-06-02 ENCOUNTER — Other Ambulatory Visit: Payer: Self-pay

## 2020-06-02 ENCOUNTER — Encounter: Payer: Self-pay | Admitting: Cardiovascular Disease

## 2020-06-02 ENCOUNTER — Ambulatory Visit (INDEPENDENT_AMBULATORY_CARE_PROVIDER_SITE_OTHER): Payer: BC Managed Care – PPO | Admitting: Cardiovascular Disease

## 2020-06-02 DIAGNOSIS — E782 Mixed hyperlipidemia: Secondary | ICD-10-CM

## 2020-06-02 DIAGNOSIS — Z8249 Family history of ischemic heart disease and other diseases of the circulatory system: Secondary | ICD-10-CM | POA: Diagnosis not present

## 2020-06-02 DIAGNOSIS — R0789 Other chest pain: Secondary | ICD-10-CM | POA: Diagnosis not present

## 2020-06-02 DIAGNOSIS — R001 Bradycardia, unspecified: Secondary | ICD-10-CM | POA: Diagnosis not present

## 2020-06-02 DIAGNOSIS — I10 Essential (primary) hypertension: Secondary | ICD-10-CM | POA: Insufficient documentation

## 2020-06-02 DIAGNOSIS — E785 Hyperlipidemia, unspecified: Secondary | ICD-10-CM | POA: Insufficient documentation

## 2020-06-02 NOTE — Assessment & Plan Note (Signed)
History of hyperlipidemia on low-dose statin therapy with lipid profile performed 04/09/2020 revealing total cholesterol 202, LDL of 121 and HDL 40.

## 2020-06-02 NOTE — Telephone Encounter (Signed)
Enrolled patient for a 14 day Zio XT Monitor to be mailed to patients home  

## 2020-06-02 NOTE — Patient Instructions (Signed)
Medication Instructions:  Your physician recommends that you continue on your current medications as directed. Please refer to the Current Medication list given to you today.  *If you need a refill on your cardiac medications before your next appointment, please call your pharmacy*   Testing/Procedures: Your physician has requested that you have an echocardiogram. Echocardiography is a painless test that uses sound waves to create images of your heart. It provides your doctor with information about the size and shape of your heart and how well your hearts chambers and valves are working. This procedure takes approximately one hour. There are no restrictions for this procedure. -- [address]  ZIO Monitor Instructions   Your physician has requested you wear your ZIO patch monitor 14 days.   This is a single patch monitor.  [employer] supplies one patch monitor per enrollment.  Additional stickers are not available.   Please do not apply patch if you will be having a Nuclear Stress Test, Echocardiogram, Cardiac CT, MRI, or Chest Xray during the time frame you would be wearing the monitor. The patch cannot be worn during these tests.  You cannot remove and re-apply the ZIO XT patch monitor.   Your ZIO patch monitor will be sent USPS Priority mail from [facility] directly to your home address. The monitor may also be mailed to a PO BOX if home delivery is not available.   It may take 3-5 days to receive your monitor after you have been enrolled.   Once you have received you monitor, please review enclosed instructions.  Your monitor has already been registered assigning a specific monitor serial # to you.   Applying the monitor   Shave hair from upper left chest.   Hold abrader disc by orange tab.  Rub abrader in 40 strokes over left upper chest as indicated in your monitor instructions.   Clean area with 4 enclosed alcohol pads .  Use all pads to assure are is  cleaned thoroughly.  Let dry.   Apply patch as indicated in monitor instructions.  Patch will be place under collarbone on left side of chest with arrow pointing upward.   Rub patch adhesive wings for 2 minutes.Remove white label marked "1".  Remove white label marked "2".  Rub patch adhesive wings for 2 additional minutes.   While looking in a mirror, press and release button in center of patch.  A small green light will flash 3-4 times .  This will be your only indicator the monitor has been turned on.     Do not shower for the first 24 hours.  You may shower after the first 24 hours.   Press button if you feel a symptom. You will hear a small click.  Record Date, Time and Symptom in the Patient Log Book.   When you are ready to remove patch, follow instructions on last 2 pages of Patient Log Book.  Stick patch monitor onto last page of Patient Log Book.   Place Patient Log Book in [place] box.  Use locking tab on box and tape box closed securely.  The [employer] and [employer] has [employer] on it.  Please place in mailbox as soon as possible.  Your physician should have your test results approximately 7 days after the monitor has been mailed back to [facility].   Call [place] at [phone] if you have questions regarding your ZIO XT patch monitor.  Call them immediately if you  see an orange light blinking on your monitor.   If your monitor falls off in less than 4 days contact our Monitor department at [phone].  If your monitor becomes loose or falls off after 4 days call [employer] at [phone] for suggestions on securing your monitor.    Dr. Gwenlyn Found has ordered a CT coronary calcium score. This test is done at [address]. This is $150 out of pocket.   Coronary CalciumScan A coronary calcium scan is an imaging test used to look for deposits of calcium and other fatty materials (plaques) in the inner lining of the blood vessels of the  heart (coronary arteries). These deposits of calcium and plaques can partly clog and narrow the coronary arteries without producing any symptoms or warning signs. This puts a person at risk for a heart attack. This test can detect these deposits before symptoms develop. Tell a health care provider about:  Any allergies you have.  All medicines you are taking, including vitamins, herbs, eye drops, creams, and over-the-counter medicines.  Any problems you or family members have had with anesthetic medicines.  Any blood disorders you have.  Any surgeries you have had.  Any medical conditions you have.  Whether you are pregnant or may be pregnant. What are the risks? Generally, this is a safe procedure. However, problems may occur, including:  Harm to a pregnant woman and her unborn baby. This test involves the use of radiation. Radiation exposure can be dangerous to a pregnant woman and her unborn baby. If you are pregnant, you generally should not have this procedure done.  Slight increase in the risk of cancer. This is because of the radiation involved in the test. What happens before the procedure? No preparation is needed for this procedure. What happens during the procedure?  You will undress and remove any jewelry around your neck or chest.  You will put on a hospital gown.  Sticky electrodes will be placed on your chest. The electrodes will be connected to an electrocardiogram (ECG) machine to record a tracing of the electrical activity of your heart.  A CT scanner will take pictures of your heart. During this time, you will be asked to lie still and hold your breath for 2-3 seconds while a picture of your heart is being taken. The procedure may vary among health care providers and hospitals. What happens after the procedure?  You can get dressed.  You can return to your normal activities.  It is up to you to get the results of your test. Ask your health care provider, or  the department that is doing the test, when your results will be ready. Summary  A coronary calcium scan is an imaging test used to look for deposits of calcium and other fatty materials (plaques) in the inner lining of the blood vessels of the heart (coronary arteries).  Generally, this is a safe procedure. Tell your health care provider if you are pregnant or may be pregnant.  No preparation is needed for this procedure.  A CT scanner will take pictures of your heart.  You can return to your normal activities after the scan is done. This information is not intended to replace advice given to you by your health care provider. Make sure you discuss any questions you have with your health care provider. Document Released: 12/23/2007 Document Revised: 05/15/2016 Document Reviewed: 05/15/2016 Elsevier Interactive Patient Education  2017 [employer].     Follow-Up: At [facility],  you and your health needs are our priority.  As part of our continuing mission to provide you with exceptional heart care, we have created designated Provider Care Teams.  These Care Teams include your primary Cardiologist (physician) and Advanced Practice Providers (APPs -  Physician Assistants and Nurse Practitioners) who all work together to provide you with the care you need, when you need it.  We recommend signing up for the patient portal called "MyChart".  Sign up information is provided on this After Visit Summary.  MyChart is used to connect with patients for Virtual Visits (Telemedicine).  Patients are able to view lab/test results, encounter notes, upcoming appointments, etc.  Non-urgent messages can be sent to your provider as well.   To learn more about what you can do with MyChart, go to [email or web address].    Your next appointment:   3 month(s)  The format for your next appointment:   In Person  Provider:   You may see Dr. Gwenlyn Found or one of the following Advanced Practice Providers on your  designated Care Team:    Kerin Ransom, PA-C  [place], [place]  Coletta Memos, [place]    Other Instructions

## 2020-06-02 NOTE — Assessment & Plan Note (Addendum)
History of essential potential blood pressure measured today 162/100.  Repeat blood pressure was 156/96.  She is on Micardis/hydrochlorothiazide although she did not take her medication today.

## 2020-06-02 NOTE — Assessment & Plan Note (Signed)
Heart rates in the 40s by apple watch during nocturnal sleep/sleep.  I am to get a 2-week Zio patch to further evaluate

## 2020-06-02 NOTE — Progress Notes (Signed)
06/02/2020 Adrienne Freeman   1966/04/22  [id number]  Primary Physician Harlan Stains, MD Primary Cardiologist: Lorretta Harp MD [name], [place]  HPI:  Adrienne Freeman is a 54 y.o. moderately overweight married Caucasian female mother of 3 children, grandmother of 1 grandchild who Sister Antony Salmon was also a patient of mine.  She was referred by Dr. Dema Severin for asymptomatic bradycardia.  She does have a history of mild obstructive sleep apnea, treated hypertension hyperlipidemia.  She has a family history of heart disease with a sister and mother both of whom had ischemic heart disease.  She does have anxiety depression medications.  She gets occasional atypical chest pain.  She wears an apple watch and apparently has had some nocturnal bradycardia in the 40s but is asymptomatic from this.   Current Meds  Medication Sig  . acetaminophen (TYLENOL) 325 MG tablet Take 325 mg by mouth every 6 (six) hours as needed.  . Ascorbic Acid (VITAMIN C PO) Take 1 tablet by mouth daily.  . B Complex Vitamins (VITAMIN B COMPLEX PO) Take 1 tablet by mouth daily.  . calcium carbonate (TUMS - DOSED IN MG ELEMENTAL CALCIUM) 500 MG chewable tablet Chew 1 tablet by mouth daily. Chewable tablet  . Cholecalciferol 25 MCG (1000 UT) tablet Take 50,000 Units by mouth once a week.  . clorazepate (TRANXENE) 3.75 MG tablet Take 3.75 mg by mouth daily as needed.  . ergocalciferol (VITAMIN D2) 50000 UNITS capsule Take 50,000 Units by mouth once a week.   Marland Kitchen ibuprofen (ADVIL) 600 MG tablet Take 1 tablet (600 mg total) by mouth every 6 (six) hours as needed.  Marland Kitchen KLOR-CON M20 20 MEQ tablet Take 20 mEq by mouth daily.  Marland Kitchen levothyroxine (SYNTHROID, LEVOTHROID) 50 MCG tablet Take 50 mcg by mouth daily.  . Multiple Vitamin (MULTIVITAMIN WITH MINERALS) TABS Take 1 tablet by mouth daily.  . naproxen sodium (ANAPROX) 220 MG tablet Take 220 mg by mouth as needed.   . neomycin-polymyxin-hydrocortisone  (CORTISPORIN) 3.5-10000-1 otic suspension Place 2-5 drops into both ears 4 (four) times daily.  Marland Kitchen omega-3 acid ethyl esters (LOVAZA) 1 g capsule Take 1 capsule by mouth 2 (two) times daily.  Marland Kitchen POTASSIUM CHLORIDE PO Take by mouth.  . rosuvastatin (CRESTOR) 5 MG tablet Take 5 mg by mouth daily.  Marland Kitchen telmisartan-hydrochlorothiazide (MICARDIS HCT) 80-25 MG tablet Take 1 tablet by mouth daily.  Marland Kitchen telmisartan-hydrochlorothiazide (MICARDIS HCT) 80-25 MG tablet Take 1 tablet by mouth daily.  . TRINTELLIX 5 MG TABS tablet Take 5 mg by mouth daily.     Allergies  Allergen Reactions  . Buspar [Buspirone] Shortness Of Breath  . Escitalopram Oxalate Shortness Of Breath  . Keflex [Cephalexin] Anaphylaxis    headache  . Mepergan [Meperidine-Promethazine] Other (See Comments)    Causes  Chest  Pain.  . Remeron [Mirtazapine] Anaphylaxis  . Risperdal [Risperidone] Anaphylaxis  . Toprol Xl [Metoprolol Succinate] Shortness Of Breath  . Altace [Ramipril]   . Anafranil [Clomipramine Hcl]     Jerking body movements  . Benicar Hct [Olmesartan Medoxomil-Hctz]     Memory difficulties   . Bupropion Other (See Comments)    Reaction unknown  . Cholestatin Other (See Comments)    Reaction unknown  . Codeine Other (See Comments)    Patient states that doctor told her that she was allergic to this medication.  Marland Kitchen Cymbalta [Duloxetine Hcl]     Worsening depression   . Effexor [Venlafaxine Hydrochloride] Other (See  Comments)    Patient states that doctor told her that she was allergic to this medication.  Marland Kitchen Nexium [Esomeprazole Magnesium]     Sensation of throat closing   . Paroxetine Hcl Other (See Comments)    Patient states that doctor told her that she was allergic to this medication.  . Protonix [Pantoprazole Sodium]   . Prozac [Fluoxetine Hcl]     Anger/anxiety  . Ramipril Other (See Comments)    Patient states that doctor told her that she was allergic to this medication.  . Sertraline Hcl Other  (See Comments)    Patient states that this medication makes her OCD worse.  . Toprol Xl [Metoprolol Tartrate]     Can't recall   . Prednisone Anxiety    Social History   Socioeconomic History  . Marital status: Married    Spouse name: Not on file  . Number of children: 3  . Years of education: 48  . Highest education level: Not on file  Occupational History  . Occupation: unemployed  Tobacco Use  . Smoking status: Former Smoker    Packs/day: 0.25    Years: 2.00    Pack years: 0.50    Types: Cigarettes    Quit date: 07/10/1984    Years since quitting: 35.9  . Smokeless tobacco: Never Used  Substance and Sexual Activity  . Alcohol use: Yes    Comment: social  . Drug use: No  . Sexual activity: Yes    Birth control/protection: None    Comment: husband vasectomy  Other Topics Concern  . Not on file  Social History Narrative  . Not on file   Social Determinants of Health   Financial Resource Strain:   . Difficulty of Paying Living Expenses: Not on file  Food Insecurity:   . Worried About Charity fundraiser in the Last Year: Not on file  . Ran Out of Food in the Last Year: Not on file  Transportation Needs:   . Lack of Transportation (Medical): Not on file  . Lack of Transportation (Non-Medical): Not on file  Physical Activity:   . Days of Exercise per Week: Not on file  . Minutes of Exercise per Session: Not on file  Stress:   . Feeling of Stress : Not on file  Social Connections:   . Frequency of Communication with Friends and Family: Not on file  . Frequency of Social Gatherings with Friends and Family: Not on file  . Attends Religious Services: Not on file  . Active Member of Clubs or Organizations: Not on file  . Attends Archivist Meetings: Not on file  . Marital Status: Not on file  Intimate Partner Violence:   . Fear of Current or Ex-Partner: Not on file  . Emotionally Abused: Not on file  . Physically Abused: Not on file  . Sexually Abused:  Not on file     Review of Systems: General: negative for chills, fever, night sweats or weight changes.  Cardiovascular: negative for chest pain, dyspnea on exertion, edema, orthopnea, palpitations, paroxysmal nocturnal dyspnea or shortness of breath Dermatological: negative for rash Respiratory: negative for cough or wheezing Urologic: negative for hematuria Abdominal: negative for nausea, vomiting, diarrhea, bright red blood per rectum, melena, or hematemesis Neurologic: negative for visual changes, syncope, or dizziness All other systems reviewed and are otherwise negative except as noted above.    Blood pressure (!) 162/100, pulse (!) 59, height 5\' 3"  (1.6 m), weight 191 lb (86.6 kg).  General appearance: alert and no distress Neck: no adenopathy, no carotid bruit, no JVD, supple, symmetrical, trachea midline and thyroid not enlarged, symmetric, no tenderness/mass/nodules Lungs: clear to auscultation bilaterally Heart: regular rate and rhythm, S1, S2 normal, no murmur, click, rub or gallop Extremities: extremities normal, atraumatic, no cyanosis or edema Pulses: 2+ and symmetric Skin: Skin color, texture, turgor normal. No rashes or lesions Neurologic: Alert and oriented X 3, normal strength and tone. Normal symmetric reflexes. Normal coordination and gait  EKG sinus bradycardia 59 without ST or T wave changes.  Personally reviewed this EKG.  Lynzie Cliburn show the left parietal okay answers yes  ASSESSMENT AND PLAN:   Essential hypertension History of essential potential blood pressure measured today 162/100.  Repeat blood pressure was 156/96.  She is on Micardis/hydrochlorothiazide although she did not take her medication today.  Hyperlipidemia History of hyperlipidemia on low-dose statin therapy with lipid profile performed 04/09/2020 revealing total cholesterol 202, LDL of 121 and HDL 40.  Slow heart rate Heart rates in the 40s by apple watch during nocturnal sleep/sleep.  I am to  get a 2-week Zio patch to further evaluate      Lorretta Harp MD [facility], [facility] 06/02/2020 4:52 PM

## 2020-06-07 DIAGNOSIS — F4000 Agoraphobia, unspecified: Secondary | ICD-10-CM | POA: Diagnosis not present

## 2020-06-07 DIAGNOSIS — F332 Major depressive disorder, recurrent severe without psychotic features: Secondary | ICD-10-CM | POA: Diagnosis not present

## 2020-06-08 ENCOUNTER — Ambulatory Visit (INDEPENDENT_AMBULATORY_CARE_PROVIDER_SITE_OTHER): Payer: BC Managed Care – PPO

## 2020-06-08 ENCOUNTER — Telehealth: Payer: Self-pay | Admitting: *Deleted

## 2020-06-08 DIAGNOSIS — R001 Bradycardia, unspecified: Secondary | ICD-10-CM

## 2020-06-08 NOTE — Telephone Encounter (Signed)
Left message for patient regarding appointment for Calcium scoring scheduling 06/22/20 at 2:30 pm at [address]---arrival time is 2:15pm for check in ---will mail information to patient

## 2020-06-22 ENCOUNTER — Inpatient Hospital Stay: Admission: RE | Admit: 2020-06-22 | Payer: BC Managed Care – PPO | Source: Ambulatory Visit

## 2020-06-22 DIAGNOSIS — F332 Major depressive disorder, recurrent severe without psychotic features: Secondary | ICD-10-CM | POA: Diagnosis not present

## 2020-06-22 DIAGNOSIS — F4000 Agoraphobia, unspecified: Secondary | ICD-10-CM | POA: Diagnosis not present

## 2020-07-05 ENCOUNTER — Ambulatory Visit (INDEPENDENT_AMBULATORY_CARE_PROVIDER_SITE_OTHER)
Admission: RE | Admit: 2020-07-05 | Discharge: 2020-07-05 | Disposition: A | Discharge status: Home / Self Care | Payer: Self-pay | Source: Ambulatory Visit | Attending: Cardiovascular Disease | Admitting: Cardiovascular Disease

## 2020-07-05 ENCOUNTER — Ambulatory Visit (HOSPITAL_COMMUNITY): Payer: BC Managed Care – PPO | Attending: Cardiology

## 2020-07-05 ENCOUNTER — Other Ambulatory Visit: Payer: Self-pay

## 2020-07-05 DIAGNOSIS — E782 Mixed hyperlipidemia: Secondary | ICD-10-CM | POA: Diagnosis not present

## 2020-07-05 DIAGNOSIS — Z8249 Family history of ischemic heart disease and other diseases of the circulatory system: Secondary | ICD-10-CM

## 2020-07-05 DIAGNOSIS — R0789 Other chest pain: Secondary | ICD-10-CM

## 2020-07-05 DIAGNOSIS — R001 Bradycardia, unspecified: Secondary | ICD-10-CM | POA: Diagnosis not present

## 2020-07-05 LAB — ECHOCARDIOGRAM COMPLETE
Area-P 1/2: 3.42 cm2
P 1/2 time: 395 msec
S' Lateral: 3 cm

## 2020-07-06 DIAGNOSIS — G4733 Obstructive sleep apnea (adult) (pediatric): Secondary | ICD-10-CM | POA: Diagnosis not present

## 2020-07-12 DIAGNOSIS — R0902 Hypoxemia: Secondary | ICD-10-CM | POA: Diagnosis not present

## 2020-07-16 ENCOUNTER — Telehealth: Payer: Self-pay | Admitting: Cardiovascular Disease

## 2020-07-16 NOTE — Telephone Encounter (Signed)
Patient call and reviewed echo & CT cardiac score results

## 2020-07-16 NOTE — Telephone Encounter (Signed)
Beatrix Fetters, RN  07/08/2020 10:00 AM EST      Letter sent 07/08/20.   Lorretta Harp, MD  07/05/2020 4:08 PM EST      Cor CA score of 0. No evid of CAD. Ao root upper limit of nl.

## 2020-07-16 NOTE — Telephone Encounter (Signed)
Beatrix Fetters, RN  07/08/2020 9:54 AM EST      Letter sent 07/08/20.   Lorretta Harp, MD  07/06/2020 3:51 PM EST      Call and tell normal except for mildly dilated ascending thoracic Ao of 40 mm. Repeat 24 months

## 2020-07-16 NOTE — Telephone Encounter (Signed)
Adrienne Freeman is calling requesting a nurse call her to discuss the results of her Echo. Please advise.

## 2020-08-31 ENCOUNTER — Encounter: Payer: Self-pay | Admitting: Cardiovascular Disease

## 2020-08-31 ENCOUNTER — Other Ambulatory Visit: Payer: Self-pay

## 2020-08-31 ENCOUNTER — Ambulatory Visit (INDEPENDENT_AMBULATORY_CARE_PROVIDER_SITE_OTHER): Payer: BC Managed Care – PPO | Admitting: Cardiovascular Disease

## 2020-08-31 VITALS — BP 112/80 | HR 60 | Ht 63.0 in | Wt 190.0 lb

## 2020-08-31 DIAGNOSIS — Z8249 Family history of ischemic heart disease and other diseases of the circulatory system: Secondary | ICD-10-CM | POA: Diagnosis not present

## 2020-08-31 DIAGNOSIS — R0789 Other chest pain: Secondary | ICD-10-CM

## 2020-08-31 DIAGNOSIS — E782 Mixed hyperlipidemia: Secondary | ICD-10-CM

## 2020-08-31 DIAGNOSIS — I10 Essential (primary) hypertension: Secondary | ICD-10-CM

## 2020-08-31 DIAGNOSIS — R001 Bradycardia, unspecified: Secondary | ICD-10-CM

## 2020-08-31 NOTE — Assessment & Plan Note (Signed)
History of essential hypertension blood pressure measured today 112/80.  She is on Micardis/hydrochlorothiazide.

## 2020-08-31 NOTE — Progress Notes (Signed)
08/31/2020 Adrienne Freeman   January 23, 1966  [id number]  Primary Physician Harlan Stains, MD Primary Cardiologist: Lorretta Harp MD [name], [place]  HPI:  Adrienne Freeman is a 55 y.o.  moderately overweight married Caucasian female mother of 3 children, grandmother of 1 grandchild who Sister Adrienne Freeman was also a patient of mine.  She was referred by Dr. Dema Severin for asymptomatic bradycardia.  I last saw her in the office 06/02/2020. She does have a history of mild obstructive sleep apnea, treated hypertension hyperlipidemia.  She has a family history of heart disease with a sister and mother both of whom had ischemic heart disease.  She does have anxiety depression medications.  She gets occasional atypical chest pain.  She wears an apple watch and apparently has had some nocturnal bradycardia in the 40s but is asymptomatic from this.  Since I saw her last she did have an event monitor that showed PACs, PVCs and short runs of SVT.  2D echo was essentially normal except for a ascending thoracic aorta measuring 40 mm and a coronary calcium score was 0.  She gets occasional tachypalpitations at night when she is lying on her side.    Current Meds  Medication Sig  . acetaminophen (TYLENOL) 325 MG tablet Take 325 mg by mouth every 6 (six) hours as needed.  . Ascorbic Acid (VITAMIN C PO) Take 1 tablet by mouth daily.  . B Complex Vitamins (VITAMIN B COMPLEX PO) Take 1 tablet by mouth daily.  . calcium carbonate (TUMS - DOSED IN MG ELEMENTAL CALCIUM) 500 MG chewable tablet Chew 1 tablet by mouth daily. Chewable tablet  . Cholecalciferol 25 MCG (1000 UT) tablet Take 50,000 Units by mouth once a week.  . clorazepate (TRANXENE) 3.75 MG tablet Take 3.75 mg by mouth daily as needed.  . ergocalciferol (VITAMIN D2) 50000 UNITS capsule Take 50,000 Units by mouth once a week.  Marland Kitchen ibuprofen (ADVIL) 600 MG tablet Take 1 tablet (600 mg total) by mouth every 6 (six) hours as needed.  Marland Kitchen  KLOR-CON M20 20 MEQ tablet Take 20 mEq by mouth daily.  Marland Kitchen levothyroxine (SYNTHROID, LEVOTHROID) 50 MCG tablet Take 50 mcg by mouth daily.  . Multiple Vitamin (MULTIVITAMIN WITH MINERALS) TABS Take 1 tablet by mouth daily.  . naproxen sodium (ANAPROX) 220 MG tablet Take 220 mg by mouth as needed.   . neomycin-polymyxin-hydrocortisone (CORTISPORIN) 3.5-10000-1 otic suspension Place 2-5 drops into both ears 4 (four) times daily.  Marland Kitchen omega-3 acid ethyl esters (LOVAZA) 1 g capsule Take 1 capsule by mouth 2 (two) times daily.  Marland Kitchen POTASSIUM CHLORIDE PO Take by mouth.  . rosuvastatin (CRESTOR) 5 MG tablet Take 5 mg by mouth daily.  Marland Kitchen telmisartan-hydrochlorothiazide (MICARDIS HCT) 80-25 MG tablet Take 1 tablet by mouth daily.  Marland Kitchen telmisartan-hydrochlorothiazide (MICARDIS HCT) 80-25 MG tablet Take 1 tablet by mouth daily.  . TRINTELLIX 5 MG TABS tablet Take 5 mg by mouth daily.     Allergies  Allergen Reactions  . Buspar [Buspirone] Shortness Of Breath  . Escitalopram Oxalate Shortness Of Breath  . Keflex [Cephalexin] Anaphylaxis    headache  . Mepergan [Meperidine-Promethazine] Other (See Comments)    Causes  Chest  Pain.  . Remeron [Mirtazapine] Anaphylaxis  . Risperdal [Risperidone] Anaphylaxis  . Toprol Xl [Metoprolol Succinate] Shortness Of Breath  . Altace [Ramipril]   . Anafranil [Clomipramine Hcl]     Jerking body movements  . Benicar Hct [Olmesartan Medoxomil-Hctz]  Memory difficulties   . Bupropion Other (See Comments)    Reaction unknown  . Cholestatin Other (See Comments)    Reaction unknown  . Codeine Other (See Comments)    Patient states that doctor told her that she was allergic to this medication.  Marland Kitchen Cymbalta [Duloxetine Hcl]     Worsening depression   . Effexor [Venlafaxine Hydrochloride] Other (See Comments)    Patient states that doctor told her that she was allergic to this medication.  Marland Kitchen Nexium [Esomeprazole Magnesium]     Sensation of throat closing   .  Paroxetine Hcl Other (See Comments)    Patient states that doctor told her that she was allergic to this medication.  . Protonix [Pantoprazole Sodium]   . Prozac [Fluoxetine Hcl]     Anger/anxiety  . Ramipril Other (See Comments)    Patient states that doctor told her that she was allergic to this medication.  . Sertraline Hcl Other (See Comments)    Patient states that this medication makes her OCD worse.  . Toprol Xl [Metoprolol Tartrate]     Can't recall   . Prednisone Anxiety    Social History   Socioeconomic History  . Marital status: Married    Spouse name: Not on file  . Number of children: 3  . Years of education: 49  . Highest education level: Not on file  Occupational History  . Occupation: unemployed  Tobacco Use  . Smoking status: Former Smoker    Packs/day: 0.25    Years: 2.00    Pack years: 0.50    Types: Cigarettes    Quit date: 07/10/1984    Years since quitting: 36.1  . Smokeless tobacco: Never Used  Substance and Sexual Activity  . Alcohol use: Yes    Comment: social  . Drug use: No  . Sexual activity: Yes    Birth control/protection: None    Comment: husband vasectomy  Other Topics Concern  . Not on file  Social History Narrative  . Not on file   Social Determinants of Health   Financial Resource Strain: Not on file  Food Insecurity: Not on file  Transportation Needs: Not on file  Physical Activity: Not on file  Stress: Not on file  Social Connections: Not on file  Intimate Partner Violence: Not on file     Review of Systems: General: negative for chills, fever, night sweats or weight changes.  Cardiovascular: negative for chest pain, dyspnea on exertion, edema, orthopnea, palpitations, paroxysmal nocturnal dyspnea or shortness of breath Dermatological: negative for rash Respiratory: negative for cough or wheezing Urologic: negative for hematuria Abdominal: negative for nausea, vomiting, diarrhea, bright red blood per rectum, melena, or  hematemesis Neurologic: negative for visual changes, syncope, or dizziness All other systems reviewed and are otherwise negative except as noted above.    Blood pressure 112/80, pulse 60, height 5\' 3"  (1.6 m), weight 190 lb (86.2 kg).  General appearance: alert and no distress Neck: no adenopathy, no carotid bruit, no JVD, supple, symmetrical, trachea midline and thyroid not enlarged, symmetric, no tenderness/mass/nodules Lungs: clear to auscultation bilaterally Heart: regular rate and rhythm, S1, S2 normal, no murmur, click, rub or gallop Extremities: extremities normal, atraumatic, no cyanosis or edema Pulses: 2+ and symmetric Skin: Skin color, texture, turgor normal. No rashes or lesions Neurologic: Alert and oriented X 3, normal strength and tone. Normal symmetric reflexes. Normal coordination and gait  EKG not performed today  ASSESSMENT AND PLAN:   Atypical chest pain History  of atypical chest pain with a coronary calcium score of 0  Hyperlipidemia History of mild hyperlipidemia on Crestor 5 mg a day with lipid profile performed 04/09/2020 revealing total cholesterol of 202, LDL 121 and HDL of 40.  Of note, her coronary calcium score is 0.  Slow heart rate History of "slow heart rate" with a event monitor performed 06/08/2020 revealing occasional PACs, PVCs and short runs of SVT.  I did talk to her about limiting caffeine from her diet.  Essential hypertension History of essential hypertension blood pressure measured today 112/80.  She is on Micardis/hydrochlorothiazide.      Lorretta Harp MD FACP,FACC,FAHA, [facility] 08/31/2020 5:00 PM

## 2020-08-31 NOTE — Assessment & Plan Note (Signed)
History of mild hyperlipidemia on Crestor 5 mg a day with lipid profile performed 04/09/2020 revealing total cholesterol of 202, LDL 121 and HDL of 40.  Of note, her coronary calcium score is 0.

## 2020-08-31 NOTE — Assessment & Plan Note (Signed)
History of atypical chest pain with a coronary calcium score of 0. ?

## 2020-08-31 NOTE — Assessment & Plan Note (Signed)
History of "slow heart rate" with a event monitor performed 06/08/2020 revealing occasional PACs, PVCs and short runs of SVT.  I did talk to her about limiting caffeine from her diet.

## 2020-08-31 NOTE — Patient Instructions (Addendum)
Medication Instructions:  Your physician recommends that you continue on your current medications as directed. Please refer to the Current Medication list given to you today.  *If you need a refill on your cardiac medications before your next appointment, please call your pharmacy*   Testing/Procedures: Your physician has requested that you have an echocardiogram. Echocardiography is a painless test that uses sound waves to create images of your heart. It provides your doctor with information about the size and shape of your heart and how well your heart's chambers and valves are working. This procedure takes approximately one hour. There are no restrictions for this procedure. This procedure is done at [address]rd [address]. To be done in Dec 2022.    Follow-Up: At [facility], you and your health needs are our priority.  As part of our continuing mission to provide you with exceptional heart care, we have created designated Provider Care Teams.  These Care Teams include your primary Cardiologist (physician) and Advanced Practice Providers (APPs -  Physician Assistants and Nurse Practitioners) who all work together to provide you with the care you need, when you need it.  We recommend signing up for the patient portal called "MyChart".  Sign up information is provided on this After Visit Summary.  MyChart is used to connect with patients for Virtual Visits (Telemedicine).  Patients are able to view lab/test results, encounter notes, upcoming appointments, etc.  Non-urgent messages can be sent to your provider as well.   To learn more about what you can do with MyChart, go to [email or web address].    Your next appointment:   6 month(s)  The format for your next appointment:   In Person  Provider:   You will see one of the following Advanced Practice Providers on your designated Care Team:    Kerin Ransom, PA-C  [place], [place]  Coletta Memos, [place]  Then, Quay Burow, MD will plan to see you again in 12 month(s).  Referral made for diet and weight loss management.   Heart-Healthy Eating Plan Heart-healthy meal planning includes:  Eating less unhealthy fats.  Eating more healthy fats.  Making other changes in your diet. Talk with your doctor or a diet specialist (dietitian) to create an eating plan that is right for you. What are tips for following this plan? Cooking Avoid frying your food. Try to bake, boil, grill, or broil it instead. You can also reduce fat by:  Removing the skin from poultry.  Removing all visible fats from meats.  Steaming vegetables in water or broth. Meal planning  At meals, divide your plate into four equal parts: ? Fill one-half of your plate with vegetables and green salads. ? Fill one-fourth of your plate with whole grains. ? Fill one-fourth of your plate with lean protein foods.  Eat 4-5 servings of vegetables per day. A serving of vegetables is: ? 1 cup of raw or cooked vegetables. ? 2 cups of raw leafy greens.  Eat 4-5 servings of fruit per day. A serving of fruit is: ? 1 medium whole fruit. ?  cup of dried fruit. ?  cup of fresh, frozen, or canned fruit. ?  cup of 100% fruit juice.  Eat more foods that have soluble fiber. These are apples, broccoli, carrots, beans, peas, and barley. Try to get 20-30 g of fiber per day.  Eat 4-5 servings of nuts, legumes, and seeds per week: ? 1 serving of dried beans or legumes equals  cup after  being cooked. ? 1 serving of nuts is  cup. ? 1 serving of seeds equals 1 tablespoon.   General information  Eat more home-cooked food. Eat less restaurant, buffet, and fast food.  Limit or avoid alcohol.  Limit foods that are high in starch and sugar.  Avoid fried foods.  Lose weight if you are overweight.  Keep track of how much salt (sodium) you eat. This is important if you have high blood pressure. Ask your doctor to tell you more about this.  Try to  add vegetarian meals each week. Fats  Choose healthy fats. These include olive oil and canola oil, flaxseeds, walnuts, almonds, and seeds.  Eat more omega-3 fats. These include salmon, mackerel, sardines, tuna, flaxseed oil, and ground flaxseeds. Try to eat fish at least 2 times each week.  Check food labels. Avoid foods with trans fats or high amounts of saturated fat.  Limit saturated fats. ? These are often found in animal products, such as meats, butter, and cream. ? These are also found in plant foods, such as palm oil, palm kernel oil, and coconut oil.  Avoid foods with partially hydrogenated oils in them. These have trans fats. Examples are stick margarine, some tub margarines, cookies, crackers, and other baked goods. What foods can I eat? Fruits All fresh, canned (in natural juice), or frozen fruits. Vegetables Fresh or frozen vegetables (raw, steamed, roasted, or grilled). Green salads. Grains Most grains. Choose whole wheat and whole grains most of the time. Rice and pasta, including brown rice and pastas made with whole wheat. Meats and other proteins Lean, well-trimmed beef, veal, pork, and lamb. Chicken and [place] without skin. All fish and shellfish. Wild duck, rabbit, pheasant, and venison. Egg whites or low-cholesterol egg substitutes. Dried beans, peas, lentils, and tofu. Seeds and most nuts. Dairy Low-fat or nonfat cheeses, including ricotta and mozzarella. Skim or 1% milk that is liquid, powdered, or evaporated. Buttermilk that is made with low-fat milk. Nonfat or low-fat yogurt. Fats and oils Non-hydrogenated (trans-free) margarines. Vegetable oils, including soybean, sesame, sunflower, olive, peanut, safflower, corn, canola, and cottonseed. Salad dressings or mayonnaise made with a vegetable oil. Beverages Mineral water. Coffee and tea. Diet carbonated beverages. Sweets and desserts Sherbet, gelatin, and fruit ice. Small amounts of dark chocolate. Limit all sweets  and desserts. Seasonings and condiments All seasonings and condiments. The items listed above may not be a complete list of foods and drinks you can eat. Contact a dietitian for more options. What foods should I avoid? Fruits Canned fruit in heavy syrup. Fruit in cream or butter sauce. Fried fruit. Limit coconut. Vegetables Vegetables cooked in cheese, cream, or butter sauce. Fried vegetables. Grains Breads that are made with saturated or trans fats, oils, or whole milk. Croissants. Sweet rolls. Donuts. High-fat crackers, such as cheese crackers. Meats and other proteins Fatty meats, such as hot dogs, ribs, sausage, bacon, rib-eye roast or steak. High-fat deli meats, such as salami and bologna. Caviar. Domestic duck and goose. Organ meats, such as liver. Dairy Cream, sour cream, cream cheese, and creamed cottage cheese. Whole-milk cheeses. Whole or 2% milk that is liquid, evaporated, or condensed. Whole buttermilk. Cream sauce or high-fat cheese sauce. Yogurt that is made from whole milk. Fats and oils Meat fat, or shortening. Cocoa butter, hydrogenated oils, palm oil, coconut oil, palm kernel oil. Solid fats and shortenings, including bacon fat, salt pork, lard, and butter. Nondairy cream substitutes. Salad dressings with cheese or sour cream. Beverages Regular sodas and juice  drinks with added sugar. Sweets and desserts Frosting. Pudding. Cookies. Cakes. Pies. Milk chocolate or white chocolate. Buttered syrups. Full-fat ice cream or ice cream drinks. The items listed above may not be a complete list of foods and drinks to avoid. Contact a dietitian for more information. Summary  Heart-healthy meal planning includes eating less unhealthy fats, eating more healthy fats, and making other changes in your diet.  Eat a balanced diet. This includes fruits and vegetables, low-fat or nonfat dairy, lean protein, nuts and legumes, whole grains, and heart-healthy oils and fats. This information is  not intended to replace advice given to you by your health care provider. Make sure you discuss any questions you have with your health care provider. Document Revised: 08/30/2017 Document Reviewed: 08/03/2017 Elsevier Patient Education  2021 [employer].

## 2020-09-16 DIAGNOSIS — F331 Major depressive disorder, recurrent, moderate: Secondary | ICD-10-CM | POA: Diagnosis not present

## 2020-09-16 DIAGNOSIS — F4000 Agoraphobia, unspecified: Secondary | ICD-10-CM | POA: Diagnosis not present

## 2020-09-27 DIAGNOSIS — F331 Major depressive disorder, recurrent, moderate: Secondary | ICD-10-CM | POA: Diagnosis not present

## 2020-10-19 DIAGNOSIS — F331 Major depressive disorder, recurrent, moderate: Secondary | ICD-10-CM | POA: Diagnosis not present

## 2020-11-12 DIAGNOSIS — Z23 Encounter for immunization: Secondary | ICD-10-CM | POA: Diagnosis not present

## 2020-11-15 DIAGNOSIS — F331 Major depressive disorder, recurrent, moderate: Secondary | ICD-10-CM | POA: Diagnosis not present

## 2020-11-29 DIAGNOSIS — E785 Hyperlipidemia, unspecified: Secondary | ICD-10-CM | POA: Diagnosis not present

## 2020-11-29 DIAGNOSIS — E039 Hypothyroidism, unspecified: Secondary | ICD-10-CM | POA: Diagnosis not present

## 2020-11-29 DIAGNOSIS — Z0000 Encounter for general adult medical examination without abnormal findings: Secondary | ICD-10-CM | POA: Diagnosis not present

## 2020-11-29 DIAGNOSIS — E559 Vitamin D deficiency, unspecified: Secondary | ICD-10-CM | POA: Diagnosis not present

## 2020-11-29 DIAGNOSIS — Z131 Encounter for screening for diabetes mellitus: Secondary | ICD-10-CM | POA: Diagnosis not present

## 2020-11-29 DIAGNOSIS — I10 Essential (primary) hypertension: Secondary | ICD-10-CM | POA: Diagnosis not present

## 2020-12-09 DIAGNOSIS — F331 Major depressive disorder, recurrent, moderate: Secondary | ICD-10-CM | POA: Diagnosis not present

## 2020-12-23 ENCOUNTER — Encounter: Payer: Self-pay | Admitting: Psychology

## 2021-01-13 DIAGNOSIS — F331 Major depressive disorder, recurrent, moderate: Secondary | ICD-10-CM | POA: Diagnosis not present

## 2021-02-02 ENCOUNTER — Other Ambulatory Visit: Payer: Self-pay | Admitting: Family Medicine

## 2021-02-02 DIAGNOSIS — Z1231 Encounter for screening mammogram for malignant neoplasm of breast: Secondary | ICD-10-CM

## 2021-02-08 DIAGNOSIS — F331 Major depressive disorder, recurrent, moderate: Secondary | ICD-10-CM | POA: Diagnosis not present

## 2021-02-18 ENCOUNTER — Ambulatory Visit: Payer: BC Managed Care – PPO

## 2021-02-22 ENCOUNTER — Ambulatory Visit
Admission: RE | Admit: 2021-02-22 | Discharge: 2021-02-22 | Disposition: A | Discharge status: Home / Self Care | Payer: BC Managed Care – PPO | Source: Ambulatory Visit | Attending: Family Medicine | Admitting: Family Medicine

## 2021-02-22 ENCOUNTER — Other Ambulatory Visit: Payer: Self-pay

## 2021-02-22 ENCOUNTER — Encounter: Payer: Self-pay | Admitting: Hematology and Oncology

## 2021-02-22 DIAGNOSIS — Z1231 Encounter for screening mammogram for malignant neoplasm of breast: Secondary | ICD-10-CM | POA: Diagnosis not present

## 2021-03-04 ENCOUNTER — Ambulatory Visit: Payer: BC Managed Care – PPO | Admitting: Medical

## 2021-03-15 DIAGNOSIS — F331 Major depressive disorder, recurrent, moderate: Secondary | ICD-10-CM | POA: Diagnosis not present

## 2021-03-23 ENCOUNTER — Encounter: Payer: Self-pay | Admitting: Psychology

## 2021-03-23 ENCOUNTER — Other Ambulatory Visit: Payer: Self-pay

## 2021-03-23 ENCOUNTER — Encounter: Payer: BC Managed Care – PPO | Attending: Psychology | Admitting: Psychology

## 2021-03-23 DIAGNOSIS — F411 Generalized anxiety disorder: Secondary | ICD-10-CM | POA: Insufficient documentation

## 2021-03-23 DIAGNOSIS — F331 Major depressive disorder, recurrent, moderate: Secondary | ICD-10-CM | POA: Insufficient documentation

## 2021-03-23 DIAGNOSIS — F4310 Post-traumatic stress disorder, unspecified: Secondary | ICD-10-CM | POA: Insufficient documentation

## 2021-03-23 DIAGNOSIS — F09 Unspecified mental disorder due to known physiological condition: Secondary | ICD-10-CM | POA: Diagnosis not present

## 2021-03-23 DIAGNOSIS — R413 Other amnesia: Secondary | ICD-10-CM | POA: Insufficient documentation

## 2021-03-23 NOTE — Progress Notes (Signed)
Neuropsychological Consultation   Patient:   Adrienne Freeman   DOB:   October 24, 1965  MR Number:  [id number]  Location:  [facility] [place], [place] [id number] [place] [address] Dept: [phone]           Date of Service:   03/23/2021  Start Time:   10 AM End Time:   12 PM  Today's visit was an in person visit was conducted in my outpatient clinic.  The patient myself were present.  1 hour and 15 minutes was spent in formal face-to-face clinical interview and the other 45 minutes was spent with records review, report writing and setting up testing protocols.  Provider/Observer:  Ilean Skill, Psy.D.       Clinical Neuropsychologist       Billing Code/Service: 96116/96121  Chief Complaint:    Adrienne Freeman is a 55 year old female referred for neuropsychological evaluation around memory and cognitive functioning by the patient's PCP Harlan Stains, MD for neuropsychological assessment due to reports of increasing issues and concerns around short-term memory functions and attention and concentration changes.  The patient also describes some word finding difficulties and slowed information processing speed as well.  The patient has a long history of anxiety disorder and depression as well as a history of learning disability particular around language domains.  More recently, a very traumatic experience when she discovered and has been given more information over time of infidelity with her husband that caused a very powerful emotional response in the acute experience of finding out the information has created residual symptoms consistent with posttraumatic stress disorder.  The patient has a past medical history that includes anemia, atypical chest pains and essential hypertension.  The patient also has a slowed heart rate at times.  The patient has a long history of  anxiety, depression and learning disabilities with more recent panic disorder with agoraphobia and panic attacks with PTSD type symptoms.  Psychotropic medications include Trintellix for anxiety and depression.  The patient has had a number of adverse reactions to various psychotropic medications including a worsening of depression with Cymbalta and a negative response to Effexor.  The patient also reports that sertraline can make her OCD/anxiety worse.  Prednisone also exacerbates anxiety.  The patient reports that she is also has shortness of breath when taking BuSpar and and risk of anaphylactic response to Risperdal, Remeron, Escitalopram.  While not in the patient's medical records she reports that she had adverse response to medications for the treatment of her previous concerns around ADD that also exacerbated her anxiety.  I do not think that the patient has attention deficit disorder and her attentional issues are more likely to be explained by anxiety, OCD, panic and depressive symptomatology in the setting of a language-based learning disability.  Reason for Service:  Adrienne Freeman is a 55 year old female referred for neuropsychological evaluation around memory and cognitive functioning by the patient's PCP Harlan Stains, MD for neuropsychological assessment due to reports of increasing issues and concerns around short-term memory functions and attention and concentration changes.  The patient also describes some word finding difficulties and slowed information processing speed as well.  The patient has a long history of anxiety disorder and depression as well as a history of learning disability particular around language domains.  More recently, a very traumatic experience when she discovered and has been given more information over time of infidelity with  her husband that caused a very powerful emotional response in the acute experience of finding out the information has created residual  symptoms consistent with posttraumatic stress disorder.  The patient has a past medical history that includes anemia, atypical chest pains and essential hypertension.  The patient also has a slowed heart rate at times.  The patient has a long history of anxiety, depression and learning disabilities with more recent panic disorder with agoraphobia and panic attacks with PTSD type symptoms.  Psychotropic medications include Trintellix for anxiety and depression.  The patient has had a number of adverse reactions to various psychotropic medications including a worsening of depression with Cymbalta and a negative response to Effexor.  The patient also reports that sertraline can make her OCD/anxiety worse.  Prednisone also exacerbates anxiety.  The patient reports that she is also has shortness of breath when taking BuSpar and and risk of anaphylactic response to Risperdal, Remeron, Escitalopram.  While not in the patient's medical records she reports that she had adverse response to medications for the treatment of her previous concerns around ADD that also exacerbated her anxiety.  I do not think that the patient has attention deficit disorder and her attentional issues are more likely to be explained by anxiety, OCD, panic and depressive symptomatology in the setting of a language-based learning disability.  The patient reports that she has been talking with Dr. Dema Severin about concerns around her memory changes and worsening memory functions and acknowledges a great deal of anxiety and depression that have been acutely exacerbated by finding out about issues with her husband's infidelity.  The patient reports that she has had a long history of attentional difficulties and at one point was told she has ADD but it is most likely related to anxiety and OCD type symptoms.  The patient reports that she has wanted to have her memory tested over concerns around her short-term memory difficulties and how much it concerns her.   The patient reports that she is also had difficulty with word finding, confusion, traumatic memories that are intrusive in nature etc.  The patient reports that she feels like her thinking is gotten slower over the past few years and that she has had a worsening of her anxiety and depression.  She reports that sometimes she will remember things that she could not initially remember after a few minutes and sometimes she cannot.  The patient reports that cueing and recognition types of settings help her with her memory.  The patient has been seeing a counselor for her depression anxiety and emotional response particular around the learning about her husband's infidelity.  She has been having counseling for that and she and her husband have done some marital counseling.  She has had some increase in hypervigilance around her husband's activity which is understandable.  The patient acknowledges a traumatic response to finding out about her husband which would have been potential impetus for the development of PTSD symptoms in the setting of significant anxiety disorder and depression.  The patient will get lost in thoughts and sometimes when she is asked a question by others that she will have difficulty reorienting like she was dissociating or in another place when thinking.  The patient reports that she has sustained fitful sleep and has difficulty staying asleep and will wake up 1 or 2 times each night and have difficulty going back to sleep.  She reports that sometimes she will wake up "too early" in the morning.  She reports  that her appetite is fine.  The only significant concussive event was described as a time when she was 55 years old and she fell out of a camper and onto a concrete slab.  She reports that she still has a knot from this fall on the back of her head.  No other concussive events were noted.  The patient did have an MRI of the brain with and without contrast in 2015 due to a pituitary  adenoma.  While the pituitary concerns were the primary focus of the MRI there were no indications of acute intracranial abnormalities and other than a small relatively hypoenhancing 3 to 4 mm microadenoma within the right pituitary gland everything else was stable and without change.    Behavioral Observation: Adrienne Freeman  presents as a 55 y.o.-year-old Right handed Caucasian Female who appeared her stated age. her dress was Appropriate and she was Well Groomed and her manners were Appropriate to the situation.  her participation was indicative of Appropriate and Redirectable behaviors.  There were not physical disabilities noted.  she displayed an appropriate level of cooperation and motivation.     Interactions:    Active Appropriate and Redirectable  Attention:   abnormal and attention span appeared shorter than expected for age  Memory:   abnormal; remote memory intact, recent memory impaired  Visuo-spatial:  not examined  Speech (Volume):  normal  Speech:   normal; some word finding difficulties were noted  Thought Process:  Coherent and Relevant  Though Content:  Rumination; not suicidal and not homicidal  Orientation:   person, place, time/date, and situation  Judgment:   Fair  Planning:   Fair  Affect:    Anxious  Mood:    Anxious  Insight:   Good  Intelligence:   normal  Marital Status/Living: The patient was born and raised in [place] along with 7 siblings.  The patient reports that there were concerns at birth with her as the umbilical cord was wrapped around her neck at birth.  Developmental milestones were reached at the appropriate time.  The patient did have difficulty in early development around reading, some stuttering and some difficulty with arithmetic.  There were also attentional issues noted although the patient displayed anxiety starting at an early age.  The patient currently lives with her husband of 64 years and this is her  only marriage.  There have been some significant marital stressors recently.  The patient has 3 adult children who are all living on their on with a 37 year old daughter, 53 year old son and a 7 year old son.  The patient had significant difficulty when her youngest son moved to [place] adjusting to not seen her children as frequently as when she was raising them.  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  Hobbies and interest have included reading, exercise and taking care of and spending time with her family.  Substance Use:  No concerns of substance abuse are reported.  The patient denies any substance use or abuse.  Education:   HS Graduate  Medical History:   Past Medical History:  Diagnosis Date   Anemia    Anemia    Anxiety    Cancer ([place]) 01/2006   Left lower extremity malignant melanoma.   Depression    Fibroids    Uterine  GERD (gastroesophageal reflux disease)    no meds - diet controlled   Hypercholesterolemia    Hypertension    Hypothyroidism    OCD (obsessive compulsive disorder)    Sleep apnea    does not use CPAP every night   SVD (spontaneous vaginal delivery)    x 3   Thyroid disorder    Chronic lymphocytic thyroiditis.   Uterine polyp          Patient Active Problem List   Diagnosis Date Noted   Atypical chest pain 06/02/2020   Hyperlipidemia 06/02/2020   Slow heart rate 06/02/2020   Family history of heart disease 06/02/2020   Essential hypertension 06/02/2020   Cognitive and neurobehavioral dysfunction 02/22/2015   Panic disorder with agoraphobia and severe panic attacks 02/22/2015   Generalized anxiety disorder 02/22/2015   Learning disability 02/22/2015   Depression 10/16/2012   Unspecified deficiency anemia 07/21/2011              Abuse/Trauma History: The patient  does not describe any significant childhood or early adult traumatic experiences.  She did have a traumatic experience when it came to light that her husband had been just loyal to her and it had been gauged in multiple occurrences of infidelity.  When she initially found out about this it created a significant traumatic response.  Psychiatric History:  The patient has a long history of anxiety and depression and OCD type symptoms.  Anxiety and panic disorder are part of her psychiatric history.  The patient also likely has a history of learning disabilities in language domains and the development of PTSD type response more recently due to traumatically learning about her husband's infidelity.  Family Med/Psych History:  Family History  Problem Relation Age of Onset   Hypertension Mother    OCD Mother    Heart disease Father    Hypertension Father    Colon polyps Father    Alcohol abuse Sister    OCD Maternal Aunt    Pancreatic cancer Other        grandmother??    Risk of Suicide/Violence: low patient denies any suicidal or homicidal ideation.  Impression/DX:  Adrienne Freeman is a 55 year old female referred for neuropsychological evaluation around memory and cognitive functioning by the patient's PCP Harlan Stains, MD for neuropsychological assessment due to reports of increasing issues and concerns around short-term memory functions and attention and concentration changes.  The patient also describes some word finding difficulties and slowed information processing speed as well.  The patient has a long history of anxiety disorder and depression as well as a history of learning disability particular around language domains.  More recently, a very traumatic experience when she discovered and has been given more information over time of infidelity with her husband that caused a very powerful emotional response in the acute experience of finding out the information has created residual symptoms  consistent with posttraumatic stress disorder.  The patient has a past medical history that includes anemia, atypical chest pains and essential hypertension.  The patient also has a slowed heart rate at times.  The patient has a long history of anxiety, depression and learning disabilities with more recent panic disorder with agoraphobia and panic attacks with PTSD type symptoms.  Psychotropic medications include Trintellix for anxiety and depression.  The patient has had a number of adverse reactions to various psychotropic medications including a worsening of depression with Cymbalta and a negative response to Effexor.  The patient also reports that  sertraline can make her OCD/anxiety worse.  Prednisone also exacerbates anxiety.  The patient reports that she is also has shortness of breath when taking BuSpar and and risk of anaphylactic response to Risperdal, Remeron, Escitalopram.  While not in the patient's medical records she reports that she had adverse response to medications for the treatment of her previous concerns around ADD that also exacerbated her anxiety.  I do not think that the patient has attention deficit disorder and her attentional issues are more likely to be explained by anxiety, OCD, panic and depressive symptomatology in the setting of a language-based learning disability.  Disposition/Plan:  We have set the patient up for formal neuropsychological testing and we will utilize a foundational battery of the Wechsler Adult Intelligence Scale and the Wechsler Memory Scale's.  Once these are completed a determination be made as to the need for any other specific testing.  There is clear evidence of longstanding issues with depression anxiety and more recent development of PTSD symptoms.  Anxiety symptoms include OCD symptoms as well as panic attacks and some agoraphobia symptoms.  Diagnosis:    Cognitive and neurobehavioral dysfunction  Generalized anxiety disorder  Moderate episode of  recurrent major depressive disorder ([place])  Memory loss         Electronically Signed   _______________________ Ilean Skill, Psy.D. Clinical Neuropsychologist

## 2021-04-12 ENCOUNTER — Ambulatory Visit: Payer: BC Managed Care – PPO

## 2021-04-12 ENCOUNTER — Encounter: Payer: BC Managed Care – PPO | Attending: Psychology

## 2021-04-12 ENCOUNTER — Other Ambulatory Visit: Payer: Self-pay

## 2021-04-12 DIAGNOSIS — R413 Other amnesia: Secondary | ICD-10-CM | POA: Diagnosis not present

## 2021-04-12 DIAGNOSIS — F09 Unspecified mental disorder due to known physiological condition: Secondary | ICD-10-CM

## 2021-04-12 NOTE — Progress Notes (Signed)
Behavioral Observations The patient appeared well-groomed and appropriately dressed. Her manners were polite and appropriate.The patient reports that she has noticed her concentration has gotten worse since going through a traumatic event within the past few years. She also reports that her anxiety and depression have gotten worse since the traumatic event. The patient frequently got off topic and distracted during the test. She often appeared inattentive. Her effort appeared poor and she reported that she didn't want to try out of fear of getting questions wrong. The patient required frequent repetition and further explanation on nearly every item.         Neuropsychology Note  Adrienne Freeman completed 180 minutes of neuropsychological testing with technician, Dina Rich, BA, under the supervision of Ilean Skill, PsyD., Clinical Neuropsychologist. The patient did not appear overtly distressed by the testing session, per behavioral observation or via self-report to the technician. Rest breaks were offered.   Clinical Decision Making: In considering the patient's current level of functioning, level of presumed impairment, nature of symptoms, emotional and behavioral responses during clinical interview, level of literacy, and observed level of motivation/effort, a battery of tests was selected by Dr. Sima Matas during initial consultation on 03/23/2021. This was communicated to the technician. Communication between the neuropsychologist and technician was ongoing throughout the testing session and changes were made as deemed necessary based on patient performance on testing, technician observations and additional pertinent factors such as those listed above.  Tests Administered: Controlled Oral Word Association Test (COWAT; FAS & Animals)  Wechsler Adult Intelligence Scale, 4th Edition (WAIS-IV) Wechsler Memory Scale, 4th Edition (WMS-IV); Adult Battery or Older Adult Battery    Results:   COWAT FAS Total = 9 Z = -2.94 Animals Total = 10 Z = -2.33   WAIS-IV  Composite Score Summary  Scale Sum of Scaled Scores Composite Score Percentile Rank 95% Conf. Interval Qualitative Description  Verbal Comprehension 19 VCI 80 9 75-86 Low Average  Perceptual Reasoning 17 PRI 75 5 70-82 Borderline  Working Memory 10 WMI 71 3 66-80 Borderline  Processing Speed 8 PSI 68 2 63-80 Extremely Low  Full Scale 54 FSIQ 70 2 67-75 Borderline  General Ability 36 GAI 75 5 71-81 Borderline      Verbal Comprehension Subtests Summary  Subtest Raw Score Scaled Score Percentile Rank Reference Group Scaled Score SEM  Similarities 22 8 25 9  1.04  Vocabulary 26 7 16 8  0.73  Information 5 4 2 5  0.73  (Comprehension) 18 7 16 7  1.16       Perceptual Reasoning Subtests Summary  Subtest Raw Score Scaled Score Percentile Rank Reference Group Scaled Score SEM  Block Design 24 6 9 6  0.95  Matrix Reasoning 7 5 5 3  0.95  Visual Puzzles 7 6 9 5  0.85  (Figure Weights) 7 6 9 5  0.90  (Picture Completion) 2 1 0.1 1 1.24       Working Doctor, general practice Raw Score Scaled Score Percentile Rank Reference Group Scaled Score SEM  Digit Span 19 6 9 5  0.73  Arithmetic 7 4 2 5  0.90  (Letter-Number Seq.) 15 7 16 6  1.04       Processing Speed Subtests Summary  Subtest Raw Score Scaled Score Percentile Rank Reference Group Scaled Score SEM  Symbol Search 17 5 5 4  1.56  Coding 29 3 1 3  1.20  (Cancellation) 28 7 16 6  1.62       WMS-IV-Adult  Index Score Summary  Index Sum of Scaled  Scores Index Score Percentile Rank 95% Confidence Interval Qualitative Descriptor  Auditory Memory (AMI) 11 55 0.1 51-63 Extremely Low  Visual Memory (VMI) 16 62 1 58-69 Extremely Low  Visual Working Memory (VWMI) 11 73 4 68-82 Borderline  Immediate Memory (IMI) 10 49 <0.1 45-58 Extremely Low  Delayed Memory (DMI) 17 61 0.5 57-70 Extremely Low      Primary Subtest Scaled  Score Summary  Subtest Domain Raw Score Scaled Score Percentile Rank  Logical Memory I AM 9 2 0.4  Logical Memory II AM 6 3 1   Verbal Paired Associates I AM 5 3 1   Verbal Paired Associates II AM 2 3 1   Designs I VM 30 1 0.1  Designs II VM 35 5 5  Visual Reproduction I VM 23 4 2   Visual Reproduction II VM 11 6 9   Spatial Addition VWM 8 7 16   Symbol Span VWM 7 4 2       Auditory Memory Process Score Summary  Process Score Raw Score Scaled Score Percentile Rank Cumulative Percentage (Base Rate)  LM II Recognition 17 - - <=2%  VPA II Recognition 35 - - 10-16%       Visual Memory Process Score Summary  Process Score Raw Score Scaled Score Percentile Rank Cumulative Percentage (Base Rate)  DE I Content 12 1 0.1 -  DE I Spatial 8 3 1  -  DE II Content 27 7 16  -  DE II Spatial 8 7 16  -  DE II Recognition 7 - - <=2%  VR II Recognition 5 - - 26-50%      ABILITY-MEMORY ANALYSIS  Ability Score:  VCI: 80 Date of Testing:  WAIS-IV; WMS-IV 2021/04/12  Predicted Difference Method   Index Predicted WMS-IV Index Score Actual WMS-IV Index Score Difference Critical Value  Significant Difference Y/N Base Rate  Auditory Memory 90 55 35 9.43 Y <1%  Visual Memory 91 62 29 9.19 Y 1-2%  Visual Working Memory 89 73 16 11.15 Y 10-15%  Immediate Memory 88 49 39 10.27 Y <1%  Delayed Memory 90 61 29 10.03 Y 1-2%  Statistical significance (critical value) at the .01 level.   Will be included in final report   Feedback to Patient: Adrienne Freeman will return on 08/22/2021 for an interactive feedback session with Dr. Sima Matas at which time her test performances, clinical impressions and treatment recommendations will be reviewed in detail. The patient understands she can contact our office should she require our assistance before this time.  180 minutes spent face-to-face with patient administering standardized tests, 30 minutes spent scoring Environmental education officer). [CPT [id number],  [id number]]  Full report to follow.

## 2021-04-13 DIAGNOSIS — D485 Neoplasm of uncertain behavior of skin: Secondary | ICD-10-CM | POA: Diagnosis not present

## 2021-04-13 DIAGNOSIS — D1801 Hemangioma of skin and subcutaneous tissue: Secondary | ICD-10-CM | POA: Diagnosis not present

## 2021-04-15 ENCOUNTER — Other Ambulatory Visit (HOSPITAL_BASED_OUTPATIENT_CLINIC_OR_DEPARTMENT_OTHER): Payer: Self-pay

## 2021-04-15 DIAGNOSIS — G4730 Sleep apnea, unspecified: Secondary | ICD-10-CM

## 2021-04-19 ENCOUNTER — Encounter: Payer: Self-pay | Admitting: Medical

## 2021-04-19 ENCOUNTER — Other Ambulatory Visit: Payer: Self-pay

## 2021-04-19 ENCOUNTER — Ambulatory Visit (INDEPENDENT_AMBULATORY_CARE_PROVIDER_SITE_OTHER): Payer: BC Managed Care – PPO | Admitting: Medical

## 2021-04-19 VITALS — BP 114/72 | HR 56 | Ht 63.0 in | Wt 199.2 lb

## 2021-04-19 DIAGNOSIS — E669 Obesity, unspecified: Secondary | ICD-10-CM | POA: Diagnosis not present

## 2021-04-19 DIAGNOSIS — I7121 Aneurysm of the ascending aorta, without rupture: Secondary | ICD-10-CM

## 2021-04-19 DIAGNOSIS — I10 Essential (primary) hypertension: Secondary | ICD-10-CM | POA: Diagnosis not present

## 2021-04-19 DIAGNOSIS — E782 Mixed hyperlipidemia: Secondary | ICD-10-CM

## 2021-04-19 DIAGNOSIS — R002 Palpitations: Secondary | ICD-10-CM

## 2021-04-19 DIAGNOSIS — Z6835 Body mass index (BMI) 35.0-35.9, adult: Secondary | ICD-10-CM

## 2021-04-19 MED ORDER — ROSUVASTATIN CALCIUM 10 MG PO TABS
10.0000 mg | ORAL_TABLET | Freq: Every day | ORAL | 6 refills | Status: DC
Start: 2021-04-19 — End: 2022-09-15

## 2021-04-19 NOTE — Progress Notes (Signed)
Cardiology Office Note   Date:  04/19/2021   ID:  Adrienne Freeman, DOB 12/28/1965, MRN [id number]  PCP:  Harlan Stains, MD  Cardiologist:  Quay Burow, MD EP: None  Chief Complaint  Patient presents with   Follow-up    HTN   Palpitations      History of Present Illness: Adrienne Freeman is a 55 y.o. female with a PMH of HTN, HLD, OSA, asymptomatic bradycardia, anxiety, and depression, who presents for routine follow-up.  She was last evaluated by cardiology at an outpatient visit with Dr. Gwenlyn Found 08/31/20 at which time she was doing well with mention of occasional tachypalpitations when laying on her side at night. She previously underwent a cardiac monitor 05/2020 which showed occasional PACs, PVCs, and short runs of SVT. She had a calcium score study showing calcium score of 0. Echocardiogram 06/2020 showed EF 60-65%, mild LV dilation, normal LV diastolic function, no RWMA, no significant valvular abnormalities, however noted ascending aortic dilation of 29mm. She was recommended for repeat echo in 2 years for further monitoring.  She presents today for routine follow-up. She reports having trouble differentiating her palpitations from her anxiety. Suspect she may have a little of both playing a roll. She has not had increased frequency of palpitations or prolonged episodes. We reviewed her heart monitor results from 05/2020 which showed PACs, PVCs, and short runs of SVT. We discussed the nature of SVT and vagal maneuvers. She shows me her HR tracker on her phone with HR in the 50s while sleeping. Reassured her that this is a normal response to sleep. Reassuring that she has only had rare dizziness and no lightheadedness or syncope. No complaints of chest pain, SOB, or DOE. She still has occasional PND and is awaiting her sleep study next week to evaluate for OSA.      Past Medical History:  Diagnosis Date   Anemia    Anemia    Anxiety    Cancer ([place]) 01/2006   Left  lower extremity malignant melanoma.   Depression    Fibroids    Uterine   GERD (gastroesophageal reflux disease)    no meds - diet controlled   Hypercholesterolemia    Hypertension    Hypothyroidism    OCD (obsessive compulsive disorder)    Sleep apnea    does not use CPAP every night   SVD (spontaneous vaginal delivery)    x 3   Thyroid disorder    Chronic lymphocytic thyroiditis.   Uterine polyp     Past Surgical History:  Procedure Laterality Date   ABLATION     DIAGNOSTIC LAPAROSCOPY     fibroids removed   DILATION AND CURETTAGE OF UTERUS     DOPPLER ECHOCARDIOGRAPHY  01/30/11   LVEF>70%. Mild concentric LVH. Normal LA size. mild MR. Trace TR. Normal RVSP.   HYSTEROSCOPY WITH D & C N/A 08/08/2013   Procedure: DILATATION AND CURETTAGE /HYSTEROSCOPY;  Surgeon: Linda Hedges, DO;  Location: [facility];  Service: Gynecology;  Laterality: N/A;   MELANOMA EXCISION  2007   T1a melanoma from calf   WISDOM TOOTH EXTRACTION       Current Outpatient Medications  Medication Sig Dispense Refill   acetaminophen (TYLENOL) 325 MG tablet Take 325 mg by mouth every 6 (six) hours as needed.     Ascorbic Acid (VITAMIN C PO) Take 1 tablet by mouth daily.     B Complex Vitamins (VITAMIN B COMPLEX PO) Take 1 tablet by mouth daily.  calcium carbonate (TUMS - DOSED IN MG ELEMENTAL CALCIUM) 500 MG chewable tablet Chew 1 tablet by mouth daily. Chewable tablet     Cholecalciferol 25 MCG (1000 UT) tablet Take 50,000 Units by mouth once a week.     clorazepate (TRANXENE) 3.75 MG tablet Take 3.75 mg by mouth daily as needed.     ergocalciferol (VITAMIN D2) 50000 UNITS capsule Take 50,000 Units by mouth once a week.     ibuprofen (ADVIL) 600 MG tablet Take 1 tablet (600 mg total) by mouth every 6 (six) hours as needed. 30 tablet 0   KLOR-CON M20 20 MEQ tablet Take 20 mEq by mouth daily.     levothyroxine (SYNTHROID, LEVOTHROID) 50 MCG tablet Take 50 mcg by mouth daily.     Multiple Vitamin  (MULTIVITAMIN WITH MINERALS) TABS Take 1 tablet by mouth daily.     naproxen sodium (ANAPROX) 220 MG tablet Take 220 mg by mouth as needed.      neomycin-polymyxin-hydrocortisone (CORTISPORIN) 3.5-10000-1 otic suspension Place 2-5 drops into both ears 4 (four) times daily.     omega-3 acid ethyl esters (LOVAZA) 1 g capsule Take 1 capsule by mouth 2 (two) times daily.     telmisartan-hydrochlorothiazide (MICARDIS HCT) 80-25 MG tablet Take 1 tablet by mouth daily.     TRINTELLIX 5 MG TABS tablet Take 5 mg by mouth daily.     rosuvastatin (CRESTOR) 10 MG tablet Take 1 tablet (10 mg total) by mouth daily. 30 tablet 6   No current facility-administered medications for this visit.    Allergies:   Buspar [buspirone], Escitalopram oxalate, Keflex [cephalexin], Mepergan [meperidine-promethazine], Remeron [mirtazapine], Risperdal [risperidone], Toprol xl [metoprolol succinate], Altace [ramipril], Anafranil [clomipramine hcl], Benicar hct [olmesartan medoxomil-hctz], Bupropion, Cholestatin, Codeine, Cymbalta [duloxetine hcl], Effexor [venlafaxine hydrochloride], Nexium [esomeprazole magnesium], Paroxetine hcl, Protonix [pantoprazole sodium], Prozac [fluoxetine hcl], Ramipril, Sertraline hcl, Toprol xl [metoprolol tartrate], and Prednisone    Social History:  The patient  reports that she quit smoking about 36 years ago. Her smoking use included cigarettes. She has a 0.50 pack-year smoking history. She has never used smokeless tobacco. She reports current alcohol use. She reports that she does not use drugs.   Family History:  The patient's family history includes Alcohol abuse in her sister; Colon polyps in her father; Heart disease in her father; Hypertension in her father and mother; OCD in her maternal aunt and mother; Pancreatic cancer in an other family member.    ROS:  Please see the history of present illness.   Otherwise, review of systems are positive for none.   All other systems are reviewed and  negative.    PHYSICAL EXAM: VS:  BP 114/72 (BP Location: Left Arm, Patient Position: Sitting, Cuff Size: Large)   Pulse (!) 56   Ht 5\' 3"  (1.6 m)   Wt 199 lb 3.2 oz (90.4 kg)   BMI 35.29 kg/m  , BMI Body mass index is 35.29 kg/m. GEN: Well nourished, well developed, in no acute distress HEENT: normal Neck: no JVD, carotid bruits, or masses Cardiac: bradycardic, regular rhythm; no murmurs, rubs, or gallops,no edema  Respiratory:  clear to auscultation bilaterally, normal work of breathing GI: soft, nontender, nondistended, + BS MS: no deformity or atrophy Skin: warm and dry, no rash Neuro:  Strength and sensation are intact Psych: euthymic mood, full affect   EKG:  EKG is not ordered today.   Recent Labs: No results found for requested labs within last 8760 hours.    Lipid  Panel No results found for: CHOL, TRIG, HDL, CHOLHDL, VLDL, LDLCALC, LDLDIRECT    Wt Readings from Last 3 Encounters:  04/19/21 199 lb 3.2 oz (90.4 kg)  08/31/20 190 lb (86.2 kg)  06/02/20 191 lb (86.6 kg)      Other studies Reviewed: Additional studies/ records that were reviewed today include:   Echocardiogram 06/2020: 1. Left ventricular ejection fraction, by estimation, is 60 to 65%. Left  ventricular ejection fraction by 3D volume is 60 %. The left ventricle has  normal function. The left ventricle has no regional wall motion  abnormalities. The left ventricular  internal cavity size was mildly dilated. Left ventricular diastolic  parameters were normal. The average left ventricular global longitudinal  strain is -24.8 %.   2. Right ventricular systolic function is normal. The right ventricular  size is normal. Tricuspid regurgitation signal is inadequate for assessing  PA pressure.   3. The mitral valve is normal in structure. No evidence of mitral valve  regurgitation. No evidence of mitral stenosis.   4. The aortic valve is tricuspid. Aortic valve regurgitation is trivial.  No aortic  stenosis is present.   5. Aortic dilatation noted. There is dilatation of the ascending aorta,  measuring 40 mm.   Cardiac monitor 05/2020: 1. SR/SB/ST 2. Occasional PACs and PVCs 3. Short runs of SVT  CT Calcium score 06/2020:  IMPRESSION: Coronary calcium score of 0. This was 0 percentile for age and sex matched control.   Aortic root upper normal to mildly elevated (38 mm).   ASSESSMENT AND PLAN:  1. HTN: BP 114/72 today - Continue telmisartan-HCTZ  2. HLD: LDL 123 11/2020 - Will increased crestor to 10mg  daily  3. Palpitations: occurs for brief seconds at time. Often associated with anxiety. Reassuring cardiac monitor 05/2020. Addition of AV nodal blocking agents limited by baseline bradycardia. SVT and vagal maneuvers reviewed. HR is regular on exam today - Continue watchful waiting.   4. OSA: noted to have mild OSA on prior sleep study. Repeat testing ordered.  - Await repeat sleep study  5. Ascending aortic aneurysm: 45mm on echo 06/2020, recommended for monitoring q2 years - Plan for repeat study 06/2022 - Continue aggressive BP control above   6. Obesity: BMI 32. She is worried about weight gain recently and anticipating the winter months where she tends to slow down and eat more. Suggested meeting with a provider at the healthy weight [facility] wellness center.  - Will place referral to the healthy weight and wellness center   Current medicines are reviewed at length with the patient today.  The patient does not have concerns regarding medicines.  The following changes have been made:  No change  Labs/ tests ordered today include:   Orders Placed This Encounter  Procedures   Amb Ref to Medical Weight Management     Disposition:   FU with Dr. Gwenlyn Found in 6 months  Signed, Abigail Butts, PA-C  04/19/2021 11:27 AM

## 2021-04-19 NOTE — Patient Instructions (Signed)
Medication Instructions:  INCREAS CRESTOR 10MG -MAY TAKE 2 OF THE 5MG   *If you need a refill on your cardiac medications before your next appointment, please call your pharmacy*  Lab Work:   Testing/Procedures:  NONE    NONE  Special Instructions PLEASE READ AND FOLLOW SVT/VAGAL MANEUVERS-ATTACHED  Follow-Up: Your next appointment:  6 month(s) In Person with You may see Quay Burow, MD or one of the following Advanced Practice Providers on your designated Care Team:  Sande Rives, PA-C or Coletta Memos, FNP   Please call our office 2 months in advance to schedule this appointment   At [facility], you and your health needs are our priority.  As part of our continuing mission to provide you with exceptional heart care, we have created designated Provider Care Teams.  These Care Teams include your primary Cardiologist (physician) and Advanced Practice Providers (APPs -  Physician Assistants and Nurse Practitioners) who all work together to provide you with the care you need, when you need it.    Supraventricular Tachycardia, Adult Supraventricular tachycardia (SVT) is a kind of abnormal heartbeat. It makes your heart beat very fast. This may last for a short time and then return to normal, or it may last longer. A normal resting heartbeat is 60-100 times a minute. This condition can make your heart beat more than 150 times a minute. Times of having a fast heartbeat (episodes) can be scary, but they are usually not dangerous. In some cases, they may lead to heart failure if they: Happen many times a day. Last longer than a few seconds. What are the causes? This condition happens when electrical signals are sent out from areas of the heart that do not normally send signals for the heartbeat. What increases the risk? You are more likely to develop this condition if you are: Middle aged or younger. Female. The following factors may also make you more likely to develop this  condition: Stress. Feeling worried or nervous (anxiety). Tiredness. Smoking. Stimulant drugs, such as cocaine and methamphetamine. Alcohol. Caffeine. Pregnancy. Having certain medical conditions. What are the signs or symptoms? A pounding heart. A feeling that your heart is skipping beats (palpitations). Weakness. Trouble getting enough air. Pain or tightness in your chest. Dizziness or feeling like you are going to pass out (faint). Feeling worried or nervous. Sweating. Feeling like you may vomit (nausea). Passing out. Tiredness. Sometimes, there are no symptoms. How is this treated? Treatment may include: Vagal maneuvers. Ways to do this include: Holding your breath and pushing, as though you are pooping (having a bowel movement). Massaging an area on one side of your neck. Do not try this yourself. Only a doctor should do this. If done the wrong way, it can lead to a stroke. Bending forward with your head between your legs. Coughing while bending forward with your head between your legs. Putting an ice-cold, wet towel on your face. Medicines that prevent attacks. Medicine to stop an attack given through an IV tube at the hospital. A small electric shock (cardioversion) that stops an attack. A procedure to get rid of cells in the area that is causing the fast heartbeats (radiofrequency ablation). If you do not have symptoms, you may not need treatment. Follow these instructions at home: Stress Avoid things that make you feel stressed. To deal with stress, try: Doing yoga or meditation. Being out in nature. Listening to relaxing music. Doing deep breathing. Taking steps to be healthy, such as getting lots of sleep, exercising, and  eating a balanced diet. Talking with a mental health doctor. Lifestyle  Try to get at least 7 hours of sleep each night. Do not smoke or use any products that contain nicotine or tobacco. If you need help quitting, ask your doctor. Do not  drink alcohol if it gives you a fast heartbeat. If alcohol does not seem to give you a fast heartbeat, limit your alcohol use. If you drink alcohol: Limit how much you have to: 0-1 drink a day for women who are not pregnant. 0-2 drinks a day for men. Know how much alcohol is in your drink. In the [place] one drink equals one 12 oz bottle of beer (355 mL), one 5 oz glass of wine (148 mL), or one 1 oz glass of hard liquor (44 mL). Be aware of how caffeine affects you. If caffeine gives you a fast heartbeat, do not eat, drink, or use anything with caffeine in it. If caffeine does not seem to give you a fast heartbeat, limit how much caffeine you eat, drink, or use. Do not use stimulant drugs. If you need help quitting, ask your doctor. General instructions Stay at a healthy weight. Exercise regularly. Ask your doctor about good activities for you. Try one or a mixture of these: 150 minutes a week of gentle exercise, like walking or yoga. 75 minutes a week of exercise that is very active, like running or swimming. Do vagus nerve treatments to slow down your heartbeat as told by your doctor. Take over-the-counter and prescription medicines only as told by your doctor. Keep all follow-up visits. Contact a doctor if: You have a fast heartbeat more often. Times of having a fast heartbeat last longer than before. Home treatments to slow down your heartbeat do not help. You have new symptoms. Get help right away if: You have chest pain. Your symptoms get worse. You have trouble breathing. Your heart beats very fast for more than 20 minutes. You pass out. These symptoms may be an emergency. Get medical help right away. Call your local emergency services (911 in the [place] Do not wait to see if the symptoms will go away. Do not drive yourself to the hospital. Summary SVT is a type of abnormal heartbeat. This condition can make your heart beat more than 150 times a minute. If you do not have  symptoms, you may not need treatment. This information is not intended to replace advice given to you by your health care provider. Make sure you discuss any questions you have with your health care provider. Document Revised: 02/07/2020 Document Reviewed: 02/07/2020 Elsevier Patient Education  [place].

## 2021-04-27 ENCOUNTER — Other Ambulatory Visit: Payer: Self-pay

## 2021-04-27 ENCOUNTER — Ambulatory Visit (HOSPITAL_BASED_OUTPATIENT_CLINIC_OR_DEPARTMENT_OTHER): Payer: BC Managed Care – PPO | Attending: Internal Medicine | Admitting: Internal Medicine

## 2021-04-27 DIAGNOSIS — G4733 Obstructive sleep apnea (adult) (pediatric): Secondary | ICD-10-CM | POA: Insufficient documentation

## 2021-04-27 DIAGNOSIS — G4730 Sleep apnea, unspecified: Secondary | ICD-10-CM

## 2021-04-28 DIAGNOSIS — G4733 Obstructive sleep apnea (adult) (pediatric): Secondary | ICD-10-CM | POA: Diagnosis not present

## 2021-05-02 DIAGNOSIS — G4733 Obstructive sleep apnea (adult) (pediatric): Secondary | ICD-10-CM | POA: Diagnosis not present

## 2021-05-02 NOTE — Procedures (Signed)
   NAME: Adrienne Freeman DATE OF BIRTH:  1965/10/01 MEDICAL RECORD NUMBER [id number]  LOCATION: [facility]  PHYSICIAN: Marius Ditch  DATE OF STUDY: 04/27/2021  SLEEP STUDY TYPE: Nocturnal Polysomnogram               REFERRING PHYSICIAN: Marius Ditch, MD  EPWORTH SLEEPINESS SCORE:  2 HEIGHT: 5\' 3"  (160 cm)  WEIGHT: 199 lb (90.3 kg)    Body mass index is 35.25 kg/m.  NECK SIZE: 14 in.  CLINICAL INFORMATION The patient was referred to the sleep center for evaluation witnessed apnea. She had a borderline positive HST in April 2013, with an AHI of 7/hr. A HST in 2021 showed an AHI of 3/hr.  An overnight oximetry in January 2022 showed no significant hypoxemia and no pattern consistent with OSA. Her husband continues to note abnormal breathing patterns.   MEDICATIONS Patient self administered medications include: Dayvigo, CRESTOR.   SLEEP STUDY TECHNIQUE A multi-channel overnight Polysomnography study was performed. The channels recorded and monitored were central and occipital EEG, electrooculogram (EOG), submentalis EMG (chin), nasal and oral airflow, thoracic and abdominal wall motion, anterior tibialis EMG, snore microphone, electrocardiogram, and a pulse oximetry.  TECHNICAL COMMENTS Comments added by Technician: one restroom visted Comments added by Scorer: N/A  SLEEP ARCHITECTURE The study was initiated at 10:00:17 PM and terminated at 4:54:36 AM. The total recorded time was 414.3 minutes. EEG confirmed total sleep time was 372.6 minutes yielding a sleep efficiency of 89.9%%. Sleep onset after lights out was 19.7 minutes with a REM latency of 65.5 minutes. The patient spent 2.5%% of the night in stage N1 sleep, 79.6%% in stage N2 sleep, 0.1%% in stage N3 and 17.7% in REM. Wake after sleep onset (WASO) was 22.0 minutes. The Arousal Index was 12.9/hour.  RESPIRATORY PARAMETERS There were a total of 28 respiratory disturbances out of which 2 were  apneas (0 obstructive, 0 mixed, 2 central) and 26 hypopneas. The apnea/hypopnea index (AHI) was 4.5 events/hour. The central sleep apnea index was 0.3 events/hour. The REM AHI was 16.4 events/hour and NREM AHI was 2.0 events/hour. The supine AHI was 5.4 events/hour and the non supine AHI was 4 events per hour. She was supine during 39.00% of sleep. Respiratory disturbance index was 6.8 events/hour overall and 18 events/hour in REM sleep. Respiratory disturbances were associated with oxygen desaturation down to a nadir of 81.0% during sleep. The mean oxygen saturation during the study was 91.0%.  LEG MOVEMENT DATA The total leg movements were 0 with a resulting leg movement index of 0.0/hr . Associated arousal with leg movement index was 0.0/hr.  CARDIAC DATA The underlying cardiac rhythm was most consistent with sinus rhythm. Mean heart rate during sleep was 48.3 bpm. Additional rhythm abnormalities include None.  IMPRESSIONS - Very mild Obstructive Sleep Apnea (OSA) based on RDI - No significant periodic leg movements (PLMs) during sleep.   DIAGNOSIS - Obstructive sleep apnea, mild  RECOMMENDATIONS - The results will be shared with the patient and a treatment plan developed if needed.   Marius Ditch Sleep specialist, [place] [employer]  ELECTRONICALLY SIGNED ON:  05/02/2021, 7:36 PM [place] PH: [phone]   FX: [phone] [place]

## 2021-05-12 DIAGNOSIS — L578 Other skin changes due to chronic exposure to nonionizing radiation: Secondary | ICD-10-CM | POA: Diagnosis not present

## 2021-05-12 DIAGNOSIS — L705 Acne excoriee des jeunes filles: Secondary | ICD-10-CM | POA: Diagnosis not present

## 2021-05-12 DIAGNOSIS — D225 Melanocytic nevi of trunk: Secondary | ICD-10-CM | POA: Diagnosis not present

## 2021-05-12 DIAGNOSIS — D1730 Benign lipomatous neoplasm of skin and subcutaneous tissue of unspecified sites: Secondary | ICD-10-CM | POA: Diagnosis not present

## 2021-05-19 ENCOUNTER — Ambulatory Visit: Payer: BC Managed Care – PPO | Admitting: Psychology

## 2021-05-27 ENCOUNTER — Ambulatory Visit (HOSPITAL_COMMUNITY): Payer: BC Managed Care – PPO | Attending: Internal Medicine

## 2021-05-27 ENCOUNTER — Other Ambulatory Visit: Payer: Self-pay

## 2021-05-27 DIAGNOSIS — I10 Essential (primary) hypertension: Secondary | ICD-10-CM

## 2021-05-27 DIAGNOSIS — E782 Mixed hyperlipidemia: Secondary | ICD-10-CM | POA: Diagnosis not present

## 2021-05-27 LAB — ECHOCARDIOGRAM COMPLETE
Area-P 1/2: 2.96 cm2
P 1/2 time: 622 msec
S' Lateral: 2.4 cm

## 2021-06-06 DIAGNOSIS — E785 Hyperlipidemia, unspecified: Secondary | ICD-10-CM | POA: Diagnosis not present

## 2021-06-06 DIAGNOSIS — I10 Essential (primary) hypertension: Secondary | ICD-10-CM | POA: Diagnosis not present

## 2021-06-06 DIAGNOSIS — R7303 Prediabetes: Secondary | ICD-10-CM | POA: Diagnosis not present

## 2021-06-06 DIAGNOSIS — E039 Hypothyroidism, unspecified: Secondary | ICD-10-CM | POA: Diagnosis not present

## 2021-06-06 DIAGNOSIS — Z23 Encounter for immunization: Secondary | ICD-10-CM | POA: Diagnosis not present

## 2021-06-06 DIAGNOSIS — E559 Vitamin D deficiency, unspecified: Secondary | ICD-10-CM | POA: Diagnosis not present

## 2021-06-08 DIAGNOSIS — G4733 Obstructive sleep apnea (adult) (pediatric): Secondary | ICD-10-CM | POA: Diagnosis not present

## 2021-06-08 DIAGNOSIS — I10 Essential (primary) hypertension: Secondary | ICD-10-CM | POA: Diagnosis not present

## 2021-06-10 ENCOUNTER — Other Ambulatory Visit (HOSPITAL_COMMUNITY): Payer: BC Managed Care – PPO

## 2021-06-15 ENCOUNTER — Encounter: Payer: Self-pay | Admitting: Hematology and Oncology

## 2021-06-15 ENCOUNTER — Encounter: Payer: Self-pay | Admitting: Psychology

## 2021-08-09 ENCOUNTER — Encounter: Payer: Self-pay | Admitting: Hematology and Oncology

## 2021-08-09 DIAGNOSIS — K644 Residual hemorrhoidal skin tags: Secondary | ICD-10-CM | POA: Diagnosis not present

## 2021-08-09 DIAGNOSIS — K635 Polyp of colon: Secondary | ICD-10-CM | POA: Diagnosis not present

## 2021-08-09 DIAGNOSIS — Z86010 Personal history of colonic polyps: Secondary | ICD-10-CM | POA: Diagnosis not present

## 2021-08-10 ENCOUNTER — Encounter: Payer: Self-pay | Admitting: Hematology and Oncology

## 2021-08-11 DIAGNOSIS — K635 Polyp of colon: Secondary | ICD-10-CM | POA: Diagnosis not present

## 2021-08-18 ENCOUNTER — Other Ambulatory Visit: Payer: Self-pay

## 2021-08-18 ENCOUNTER — Encounter: Payer: 59 | Attending: Psychology | Admitting: Psychology

## 2021-08-18 ENCOUNTER — Ambulatory Visit: Payer: BC Managed Care – PPO | Admitting: Psychology

## 2021-08-18 ENCOUNTER — Encounter: Payer: Self-pay | Admitting: Psychology

## 2021-08-18 DIAGNOSIS — F331 Major depressive disorder, recurrent, moderate: Secondary | ICD-10-CM | POA: Insufficient documentation

## 2021-08-18 DIAGNOSIS — F4310 Post-traumatic stress disorder, unspecified: Secondary | ICD-10-CM | POA: Insufficient documentation

## 2021-08-18 DIAGNOSIS — R69 Illness, unspecified: Secondary | ICD-10-CM | POA: Diagnosis not present

## 2021-08-18 DIAGNOSIS — F411 Generalized anxiety disorder: Secondary | ICD-10-CM | POA: Insufficient documentation

## 2021-08-18 NOTE — Progress Notes (Signed)
Neuropsychological Evaluation   Patient:  Adrienne Freeman   DOB: [birth year]  MR Number: [id number]  Location: [facility] [place] [facility] [place], [id number] [place] [id number] Dept: [phone]  Start: 8 AM End: 9 AM  Provider/Observer:     Adrienne Roys PsyD  Chief Complaint:      Chief Complaint  Patient presents with   Anxiety   Depression   Stress   Post-Traumatic Stress Disorder   Memory Loss   Other    Attention and concentration deficits and likely history of some learning difficulties/disorders    Reason For Service:      Adrienne Freeman is a 56 year old female referred for neuropsychological evaluation around memory and cognitive functioning by the patient's PCP Adrienne Stains, MD.   Neuropsychological assessment due to reports of increasing issues and concerns around short-term memory functions and attention and concentration changes.  The patient also describes some word finding difficulties and slowed information processing speed as well.  The patient has a long history of anxiety disorder and depression as well as a history of learning disability particular around language domains.  More recently, a very traumatic experience when she discovered and has been given more information over time of infidelity with her husband that caused a very powerful emotional response and the acute experience of finding out the information has created residual symptoms consistent with posttraumatic stress disorder.  The patient has a past medical history that includes anemia, atypical chest pains and essential hypertension.  The patient also has a slowed heart rate at times.  The patient has a long history of anxiety, depression and learning disabilities with more recent panic disorder with agoraphobia and panic attacks with PTSD type symptoms.  Psychotropic medications include  Trintellix for anxiety and depression.  The patient has had a number of adverse reactions to various psychotropic medications including a worsening of depression with Cymbalta and a negative response to Effexor.  The patient also reports that sertraline can make her OCD/anxiety worse.  Prednisone also exacerbates anxiety.  The patient reports that she also has shortness of breath when taking BuSpar and risk of anaphylactic response to Risperdal, Remeron, Escitalopram.  While not in the patient's medical records, she reports that she had adverse response to medications for the treatment of her previous concerns around ADD that also exacerbated her anxiety.  I do not think that the patient has attention deficit disorder and her attentional issues are more likely to be explained by anxiety, OCD, panic and depressive symptomatology in the setting of a language-based learning disability.  The patient reports that she has been talking with Dr. Dema Severin about concerns around her memory changes and worsening memory functions and acknowledges a great deal of anxiety and depression that have been acutely exacerbated by finding out about issues with her husband's infidelity.  The patient reports that she has had a long history of attentional difficulties and at one point was told she has ADD but it is most likely related to anxiety and OCD type symptoms.  The patient reports that she has wanted to have her memory tested over concerns around her short-term memory difficulties and how much it concerns her.  The patient reports that she has also had difficulty with word finding, confusion, traumatic memories that are intrusive in nature etc.  The patient reports that she feels like her thinking has gotten slower over the past few years  and that she has had a worsening of her anxiety and depression.  She reports that sometimes she will remember things that she could not initially remember after a few minutes and sometimes she  cannot.  The patient reports that cueing and recognition types of settings help her with her memory.  The patient has been seeing a counselor for her depression anxiety and emotional response particular around learning about her husband's infidelity.  She has been having counseling for that and she and her husband have done some marital counseling.  She has had some increase in hypervigilance around her husband's activity which is understandable.  The patient acknowledges a traumatic response to finding out about her husband, which would have been potential impetus for the development of PTSD symptoms in the setting of significant anxiety disorder and depression.  The patient will get lost in thoughts and sometimes when she is asked a question by others that she will have difficulty reorienting like she was dissociating or in another place when thinking.  The patient reports that she has sustained fitful sleep and has difficulty staying asleep and will wake up 1 or 2 times each night and have difficulty going back to sleep.  She reports that sometimes she will wake up "too early" in the morning.  She reports that her appetite is fine.  The only significant concussive event was described as a time when she was 56 years old and she fell out of a camper and onto a concrete slab.  She reports that she still has a knot from this fall on the back of her head.  No other concussive events were noted.  The patient did have an MRI of the brain with and without contrast in 2015 due to a pituitary adenoma.  While the pituitary concerns were the primary focus of the MRI there were no indications of acute intracranial abnormalities and other than a small relatively hypoenhancing 3 to 4 mm microadenoma within the right pituitary gland everything else was stable and without change.  Tests Administered: Controlled Oral Word Association Test (COWAT; FAS & Animals)  Wechsler Adult Intelligence Scale, 4th Edition  (WAIS-IV) Wechsler Memory Scale, 4th Edition (WMS-IV); Adult Battery or Older Adult Battery   Participation Level:   Active  Participation Quality:  Inattentive and Redirectable      Behavioral Observation:  The patient appeared well-groomed and appropriately dressed. Her manners were polite and appropriate.The patient reports that she has noticed her concentration has gotten worse since going through a traumatic event within the past few years. She also reports that her anxiety and depression have gotten worse since the traumatic event. The patient frequently got off topic and distracted during the test. She often appeared inattentive. Her effort appeared poor and she reported that she didn't want to try out of fear of getting questions wrong. The patient required frequent repetition and further explanation on nearly every item.         Well Groomed, Confused, and  inattentive with question as to the degree of full effort and attempts at staying focused .   Test Results:   Initially, an estimation was made as to the patient's premorbid intellectual and cognitive functioning utilizing variables such as education and occupational history as well as other psychosocial variables.  The patient graduated from high school and primarily worked taking care of her children and raising her kids as an adult.  The patient did have hobbies and interests including reading throughout adulthood and spent a  lot of time socially taking care of and spending time with her family.  We will conservatively estimate the patient's premorbid intellectual and cognitive abilities to be in the average range and will utilize standard composite scores of around 100 for comparison purposes.  COWAT FAS Total = 9 Z = -2.94 Animals Total = 10 Z = -2.33   Initially, the patient was administered to control oral Word Association test on the FAS test which assesses lexical fluency the patient performed in the significant to severely  impaired range relative to a comparative normative population matched on the basis of education, gender and age.  This is a significantly impaired score.  On the measure of somatic fluency (animal naming) the patient showed a similar pattern of significant/severe deficits for fluency around nouns and objects.  The level of deficits noted on this test were not congruent with observed verbal fluency levels during the clinical interview or aspects of her communicative quality during the assessment.  When not under stress or strain of a specific test procedure.  The patient was likely over cautious and hesitant during this particular battery of expressive language test and therefore it is likely that the degree of deficits assessed at the least have a great impact by functional components.   WAIS-IV             Composite Score Summary        Scale Sum of Scaled Scores Composite Score Percentile Rank 95% Conf. Interval Qualitative Description  Verbal Comprehension 19 VCI 80 9 75-86 Low Average  Perceptual Reasoning 17 PRI 75 5 70-82 Borderline  Working Memory 10 WMI 71 3 66-80 Borderline  Processing Speed 8 PSI 68 2 63-80 Extremely Low  Full Scale 54 FSIQ 70 2 67-75 Borderline  General Ability 36 GAI 75 5 71-81 Borderline    The patient was administered the Wechsler Adult Intelligence Scale-IV to assess a broad range of intellectual and cognitive domains with a standardized very well normed battery of measures.  Because the patient is describing cognitive and attentional changes the drive score should not be used as a descriptor of her lifelong premorbid functioning but instead should be interpreted in the light of her current subjective complaints and our descriptors of her current cognitive functioning.  The patient produced a full-scale IQ (composite assessment of global functioning) score of 70 which falls at the 2nd percentile and is in the borderline range relative to a normative population.   This is an extremely and significantly impaired score and it is not congruent with either behavioral observations of functioning or consistent with the level of difficulties the patient is describing in her subjective reports.  Per behavioral observations the patient was very hesitant throughout the testing procedures and acknowledged not giving her greatest effort for fear of missing items/failures.  We also calculated the patient's general abilities which places less emphasis on working memory and information processing speed variables.  The patient produced general abilities index score of 75 which falls at the 5th percentile and is also in the borderline range relative to a normative population.  While there is some improvement when taking out attentional variables such as auditory encoding and focus execute abilities the patient's score is still significantly below predicted levels as well as what would be expected given behavioral observations.  Even taking out these attentional measures the patient's other components are still significantly below predicted levels even in the setting of someone with an underlying learning disability.  Functional variables including  significant anxiety overall and particularly during the test administration component as well as hesitancy to provide full effort for fear of that resulted in greater impact on her fear of errors etc.             Verbal Comprehension Subtests Summary     Subtest Raw Score Scaled Score Percentile Rank Reference Group Scaled Score SEM  Similarities 22 8 25 9  1.04  Vocabulary 26 7 16 8  0.73  Information 5 4 2 5  0.73  (Comprehension) 18 7 16 7  1.16      The patient produced a verbal comprehension index score of 80 which falls at the 9th percentile and is in the low average range relative to a normative population.  This is significantly below predicted levels.  There was considerable variability in subtest performance.  The patient performed  in the low average range with regard to her verbal reasoning and problem-solving abilities and at the mildly impaired range with regard to her vocabulary knowledge and social judgment and comprehension.  The patient's general fund of information was severely impaired with the patient having great difficulty identifying even basic knowledge of questions around geography, history another relevant life events.             Perceptual Reasoning Subtests Summary      Subtest Raw Score Scaled Score Percentile Rank Reference Group Scaled Score SEM  Block Design 24 6 9 6  0.95  Matrix Reasoning 7 5 5 3  0.95  Visual Puzzles 7 6 9 5  0.85  (Figure Weights) 7 6 9 5  0.90  (Picture Completion) 2 1 0.1 1 1.24      The patient produced a perceptual reasoning index score of 75 which falls at the 5th percentile and is in the borderline range relative to a normative population.  Again, there was some significant variability within subtest with at least 1 item being severely in profoundly impaired.  The patient performed in the mild to moderately impaired range with regard to visual analysis and organizational abilities, visual reasoning and problem-solving, visual estimation and visual prediction and judgment capacity.  The patient showed profound deficits with regard to her ability to identify visual anomalies within a visual gestalt.  Again, these are significantly below what would be predicted levels of functioning for premorbid components envelope would be predicted for issues such as learning disability.  Anxiety fear and motivation are all questioned in some of these performances.             Working Hydrographic surveyor Raw Score Scaled Score Percentile Rank Reference Group Scaled Score SEM  Digit Span 19 6 9 5  0.73  Arithmetic 7 4 2 5  0.90  (Letter-Number Seq.) 15 7 16 6  1.04      The patient produced a working memory index score of 71 which is in the borderline range relative to a  normative population and falls of the 3rd percentile relative to a normative population.  Considerable subtest scatter was noted with the patient showing mild to moderate deficits with regard to pure auditory encoding capacity with more significant deficits when she is asked to actively process current information within auditory stores.  Attentional issues and distractibility, effort, and anxiety are all likely a component of this performance.             Processing Speed Subtests Summary     Subtest Raw Score Scaled Score Percentile Rank Reference Group Scaled Score SEM  Symbol Search 17  5 5 4  1.56  Coding 29 3 1 3  1.20  (Cancellation) 28 7 16 6  1.62      The patient produced his processing speed index score of 68 which falls at the 2nd percentile and in the extremely low range relative to a normative population.  This is the area of functioning that was most impacted by her effort and apprehension as all of these measures are outwardly timed components and the patient expressed concern and apprehension and hesitancy on these measures.     WMS-IV-Adult           Index Score Summary       Index Sum of Scaled Scores Index Score Percentile Rank 95% Confidence Interval Qualitative Descriptor  Auditory Memory (AMI) 11 55 0.1 51-63 Extremely Low  Visual Memory (VMI) 16 62 1 58-69 Extremely Low  Visual Working Memory (VWMI) 11 73 4 68-82 Borderline  Immediate Memory (IMI) 10 49 <0.1 45-58 Extremely Low  Delayed Memory (DMI) 17 61 0.5 57-70 Extremely Low    The patient was then administered the Wechsler Memory Scale-IV to objectively assess multiple components of learning and memory.  On the Wechsler Adult Intelligence Scale the patient showed significant deficits with regard to auditory encoding capacity and on the current Wechsler Memory Scale's she showed an almost identical and consistent deficit with regard to visual working memory.  These are significantly impaired score but they do not  display any indications of lateralization or differences between auditory versus visual encoding.  Often when learning disabilities or other type of components are playing a significant role in this area there is usually a difference 1 where the other between auditory versus visual encoding and working memory is.  This level of deficit with regard to both auditory and visual encoding would have a significant deleterious impact on her ability to store, organize and retrieve new learning.  Break in the patient's memory function is down between auditory versus visual memory the patient produced an auditory memory index score of 55 which falls at the point 1 percentile and in the extremely low range.  While also impaired she did somewhat better on visual memory components.  The patient produced a visual memory index score of 62 which falls at the 1st percentile and in the extremely low range.  If this level of memory learning was accurate on a day-to-day basis the patient would be having profound levels of deficits functioning and real world settings and it is unlikely that this is a accurate description and component such as distractibility, anxiety, apprehension and hesitancy are all likely playing a significant role.  Brachii memory function is down between immediate versus delayed memory the patient produced an immediate memory index score of 49 which falls below the 0.1 percentile and in the extremely low range.  However, the patient's memory function is actually improved considerably for the delayed memory index as she produced a delayed memory index score of 61.  While this is in the extremely low range relative to a normative population and is significantly impaired the patient did better on delayed memory than she did immediate memory components.  On at least some measure she showed significant improvement on both auditory and visual memory components under cued recall portions.  In fact, she showed  significant improvements under her visual recognition measures and moderate improvements under her auditory recognition components which strongly suggest that she is actually learning and storing new information much better than she is able to actively and  effectively display when required to self generate these answers without cueing.  This pattern would strongly suggest that a significant component of her memory learning is not related to an inability to store and organize new information but is more around attentional components and retrieval of information which can be heavily influenced by anxiety, intrusive thinking and apprehension.            Primary Subtest Scaled Score Summary     Subtest Domain Raw Score Scaled Score Percentile Rank  Logical Memory I AM 9 2 0.4  Logical Memory II AM 6 3 1   Verbal Paired Associates I AM 5 3 1   Verbal Paired Associates II AM 2 3 1   Designs I VM 30 1 0.1  Designs II VM 35 5 5  Visual Reproduction I VM 23 4 2   Visual Reproduction II VM 11 6 9   Spatial Addition VWM 8 7 16   Symbol Span VWM 7 4 2                 Auditory Memory Process Score Summary      Process Score Raw Score Scaled Score Percentile Rank Cumulative Percentage (Base Rate)  LM II Recognition 17 - - <=2%  VPA II Recognition 35 - - 10-16%                   Visual Memory Process Score Summary      Process Score Raw Score Scaled Score Percentile Rank Cumulative Percentage (Base Rate)  DE I Content 12 1 0.1 -  DE I Spatial 8 3 1  -  DE II Content 27 7 16  -  DE II Spatial 8 7 16  -  DE II Recognition 7 - - <=2%  VR II Recognition 5 - - 26-50%          ABILITY-MEMORY ANALYSIS   Ability Score:    VCI: 80 Date of Testing:           WAIS-IV; WMS-IV 2021/04/12             Predicted Difference Method    Index Predicted WMS-IV Index Score Actual WMS-IV Index Score Difference Critical Value   Significant Difference Y/N Base Rate  Auditory Memory 90 55 35 9.43 Y <1%   Visual Memory 91 62 29 9.19 Y 1-2%  Visual Working Memory 89 73 16 11.15 Y 10-15%  Immediate Memory 88 49 39 10.27 Y <1%  Delayed Memory 90 61 29 10.03 Y 1-2%  Statistical significance (critical value) at the .01 level.      We also calculated the patient's ability/memory analysis.  While on all measures of memory and learning the patient performed a significantly below predicted levels where we produced a predictive score based on her verbal comprehension index score this is not surprising.  The patient's level of memory and learning deficits are significantly below will be predicted on more whole test but the memory and learning were quite challenging to the patient and it was clear during the behavioral observations of these measures that the patient was very apprehensive and anxious and admitted that she was having difficulty providing full effort to the testing.   Summary of Results:   Overall, the validity of the current evaluation has to be under strong question.  The patient was very apprehensive, distracted, and needed almost every question to have instructions and directions repeated.  The patient admitted that it was difficult for her to give full effort as she was apprehensive and overly  concerned about missing items producing a significant Dilatair use impact on her test results.  While the patient is reporting subjective reports of memory and cognitive difficulties the level of performance obtained on the current assessment was significantly greater than what would be predicted through behavioral observations, descriptions of current life functioning and even her ability to function on a day-to-day basis.  This level of deficits would have such a profound effect on her ability to function she would need caretakers for her to complete even simple task and there would be major safety concerns with likely inability to perform even most basic ADLs.  Impression/Diagnosis:   Overall, is going to  be difficult to truly assess other diagnostic considerations outside of significant psychiatric issues including severe anxiety and depressive symptomatology and acute functional issues.  The patient likely had some learning difficulties but the attentional deficits noted when she was a child and continuing into adulthood are very likely due to the deleterious impacts significant anxiety is having.  The patient has had difficulties in the past tolerating psychostimulant medications and SNRI type medications and even some more clean SSRI medications have proven problematic.  Given the level of anxiety it is not surprising that she has had difficulty tolerating psychostimulants and SNRIs in the past.  She does appear to be tolerating Trintellix better and any attempts at SSRI medication should be around more clean SSRI such as Prozac, Lexapro and Celexa medications.  Recent events of likely exacerbated and magnified her anxiety, apprehension and developed some PTSD-like responses.  I do think that the primary factors impacting the patient are related to anxiety, depression, PTSD and the significant impact this could have on attention and concentration.  Educational experiences around learning difficulties and attentional difficulties likely worsened her apprehension during the test procedures and the patient was very guarded, distracted and apprehensive throughout.  The patient has recently completed a formal sleep study which identified very mild obstructive sleep apnea and did not suggest the need for active use of a CPAP device.  This level of respiratory disturbance would not explain her subjective cognitive complaints but would be at a level that potentially could exacerbate some of her anxiety.  As far as treatment recommendations, I do think that we should continue to address the patient's current psychosocial stressors, significant lifelong history of anxiety and depressive symptomatology with acute  exacerbation.  Structured cognitive behavioral therapies and efforts at systematic desensitization should be part of her interventions.  Caution should be had regarding attempts at psychotropic interventions and psychostimulant medications and other medications with stimulant qualities such as SNRI should be avoided given her history.  The focus, I think should be around psychotherapeutic interventions, building better coping skills and strategies in helping her develop a more thoughtful and aware state of her patterns and building self-confidence and self-esteem.  While aggressive cognitive decline cannot be completely ruled out I do think that we have a more likely functional nature to her cognitive and memory deficits.  The patient actually did her best on cued recall and delayed recall of information which would be the opposite type of pattern on memory and learning test for progressive dementia she is such as early onset Alzheimer's or other neurological conditions as etiological factors.  Also, while the patient is unlikely to need a CPAP device for her very mild obstructive sleep apnea weight loss, increased physical activity particularly sustained physical activity and focus on high-quality nutrition/diet could also be quite helpful.  I will sit down  with the patient and provide feedback regarding the results of the current neuropsychological evaluation.  This feedback session is scheduled on 08/22/2021.  I will also make sure that this phone report is available in her EMR and notice will be sent to her referring physician Adrienne Freeman  Diagnosis:    Generalized anxiety disorder  Moderate episode of recurrent major depressive disorder ([place])  Posttraumatic stress disorder   _____________________ Ilean Skill, Psy.D. Clinical Neuropsychologist

## 2021-08-22 ENCOUNTER — Encounter: Payer: 59 | Admitting: Psychology

## 2021-08-22 ENCOUNTER — Other Ambulatory Visit: Payer: Self-pay

## 2021-08-22 DIAGNOSIS — F411 Generalized anxiety disorder: Secondary | ICD-10-CM

## 2021-08-22 DIAGNOSIS — F331 Major depressive disorder, recurrent, moderate: Secondary | ICD-10-CM

## 2021-08-22 DIAGNOSIS — F4310 Post-traumatic stress disorder, unspecified: Secondary | ICD-10-CM | POA: Diagnosis not present

## 2021-08-22 DIAGNOSIS — R69 Illness, unspecified: Secondary | ICD-10-CM | POA: Diagnosis not present

## 2021-08-26 ENCOUNTER — Telehealth: Payer: Self-pay | Admitting: *Deleted

## 2021-08-26 NOTE — Telephone Encounter (Signed)
Ms Doody called and says she has a few more questions she needs Dr Sima Matas to answer for her. She would like a call back [phone].

## 2021-08-30 NOTE — Progress Notes (Signed)
08/22/2021: 3 PM-4 PM  Today's visit was an in person visit that was conducted in my outpatient clinic office with the patient myself present.  Today I provided feedback regarding the results of the recent neuropsychological evaluation and reviewed diagnostic considerations and treatment recommendations going forward.  Below of included a copy of the summary and results from the formal neuropsychological evaluation that can be found in its entirety in the patient's records dated 08/18/2021.    Summary of Results:                        Overall, the validity of the current evaluation has to be under strong question.  The patient was very apprehensive, distracted, and needed almost every question to have instructions and directions repeated.  The patient admitted that it was difficult for her to give full effort as she was apprehensive and overly concerned about missing items producing a significant Dilatair use impact on her test results.  While the patient is reporting subjective reports of memory and cognitive difficulties the level of performance obtained on the current assessment was significantly greater than what would be predicted through behavioral observations, descriptions of current life functioning and even her ability to function on a day-to-day basis.  This level of deficits would have such a profound effect on her ability to function she would need caretakers for her to complete even simple task and there would be major safety concerns with likely inability to perform even most basic ADLs.   Impression/Diagnosis:                     Overall, is going to be difficult to truly assess other diagnostic considerations outside of significant psychiatric issues including severe anxiety and depressive symptomatology and acute functional issues.  The patient likely had some learning difficulties but the attentional deficits noted when she was a child and continuing into adulthood are very likely due to the  deleterious impacts significant anxiety is having.  The patient has had difficulties in the past tolerating psychostimulant medications and SNRI type medications and even some more clean SSRI medications have proven problematic.  Given the level of anxiety it is not surprising that she has had difficulty tolerating psychostimulants and SNRIs in the past.  She does appear to be tolerating Trintellix better and any attempts at SSRI medication should be around more clean SSRI such as Prozac, Lexapro and Celexa medications.  Recent events of likely exacerbated and magnified her anxiety, apprehension and developed some PTSD-like responses.  I do think that the primary factors impacting the patient are related to anxiety, depression, PTSD and the significant impact this could have on attention and concentration.  Educational experiences around learning difficulties and attentional difficulties likely worsened her apprehension during the test procedures and the patient was very guarded, distracted and apprehensive throughout.  The patient has recently completed a formal sleep study which identified very mild obstructive sleep apnea and did not suggest the need for active use of a CPAP device.  This level of respiratory disturbance would not explain her subjective cognitive complaints but would be at a level that potentially could exacerbate some of her anxiety.  As far as treatment recommendations, I do think that we should continue to address the patient's current psychosocial stressors, significant lifelong history of anxiety and depressive symptomatology with acute exacerbation.  Structured cognitive behavioral therapies and efforts at systematic desensitization should be part of her interventions.  Caution should  be had regarding attempts at psychotropic interventions and psychostimulant medications and other medications with stimulant qualities such as SNRI should be avoided given her history.  The focus, I think  should be around psychotherapeutic interventions, building better coping skills and strategies in helping her develop a more thoughtful and aware state of her patterns and building self-confidence and self-esteem.  While aggressive cognitive decline cannot be completely ruled out I do think that we have a more likely functional nature to her cognitive and memory deficits.  The patient actually did her best on cued recall and delayed recall of information which would be the opposite type of pattern on memory and learning test for progressive dementia she is such as early onset Alzheimer's or other neurological conditions as etiological factors.  Also, while the patient is unlikely to need a CPAP device for her very mild obstructive sleep apnea weight loss, increased physical activity particularly sustained physical activity and focus on high-quality nutrition/diet could also be quite helpful.  I will sit down with the patient and provide feedback regarding the results of the current neuropsychological evaluation.  This feedback session is scheduled on 08/22/2021.  I will also make sure that this phone report is available in her EMR and notice will be sent to her referring physician Harlan Stains   Diagnosis:                                Generalized anxiety disorder   Moderate episode of recurrent major depressive disorder ([place])   Posttraumatic stress disorder     _____________________ Ilean Skill, Psy.D. Clinical Neuropsychologist

## 2021-09-01 DIAGNOSIS — F4000 Agoraphobia, unspecified: Secondary | ICD-10-CM | POA: Diagnosis not present

## 2021-09-01 DIAGNOSIS — R69 Illness, unspecified: Secondary | ICD-10-CM | POA: Diagnosis not present

## 2021-09-01 DIAGNOSIS — I10 Essential (primary) hypertension: Secondary | ICD-10-CM | POA: Diagnosis not present

## 2021-09-01 DIAGNOSIS — F4310 Post-traumatic stress disorder, unspecified: Secondary | ICD-10-CM | POA: Diagnosis not present

## 2021-09-01 DIAGNOSIS — G4733 Obstructive sleep apnea (adult) (pediatric): Secondary | ICD-10-CM | POA: Diagnosis not present

## 2021-09-06 DIAGNOSIS — G4733 Obstructive sleep apnea (adult) (pediatric): Secondary | ICD-10-CM | POA: Diagnosis not present

## 2021-09-06 DIAGNOSIS — I10 Essential (primary) hypertension: Secondary | ICD-10-CM | POA: Diagnosis not present

## 2021-10-04 DIAGNOSIS — R42 Dizziness and giddiness: Secondary | ICD-10-CM | POA: Diagnosis not present

## 2021-10-04 DIAGNOSIS — L259 Unspecified contact dermatitis, unspecified cause: Secondary | ICD-10-CM | POA: Diagnosis not present

## 2021-10-06 DIAGNOSIS — G4733 Obstructive sleep apnea (adult) (pediatric): Secondary | ICD-10-CM | POA: Diagnosis not present

## 2021-10-06 DIAGNOSIS — I10 Essential (primary) hypertension: Secondary | ICD-10-CM | POA: Diagnosis not present

## 2021-11-06 DIAGNOSIS — G4733 Obstructive sleep apnea (adult) (pediatric): Secondary | ICD-10-CM | POA: Diagnosis not present

## 2021-11-06 DIAGNOSIS — I10 Essential (primary) hypertension: Secondary | ICD-10-CM | POA: Diagnosis not present

## 2021-11-15 ENCOUNTER — Ambulatory Visit: Payer: 59 | Admitting: Cardiovascular Disease

## 2021-11-15 ENCOUNTER — Encounter: Payer: Self-pay | Admitting: Cardiovascular Disease

## 2021-11-15 VITALS — BP 132/88 | HR 60 | Ht 63.0 in | Wt 189.0 lb

## 2021-11-15 DIAGNOSIS — E782 Mixed hyperlipidemia: Secondary | ICD-10-CM

## 2021-11-15 DIAGNOSIS — I10 Essential (primary) hypertension: Secondary | ICD-10-CM | POA: Diagnosis not present

## 2021-11-15 DIAGNOSIS — R0789 Other chest pain: Secondary | ICD-10-CM

## 2021-11-15 NOTE — Assessment & Plan Note (Signed)
History of atypical chest pain with a coronary calcium score of 0. ?

## 2021-11-15 NOTE — Progress Notes (Signed)
? ? ? ?11/15/2021 ?[place]   ?06-Oct-1965  ?[id number] ? ?Primary Physician Harlan Stains, MD ?Primary Cardiologist: Lorretta Harp MD [name], [place] ? ?HPI:  Adrienne Freeman is a 56 y.o.  moderately overweight married Caucasian female mother of 3 children, grandmother of 2 grandchildren whose Sister  ,Antony Salmon, was also a patient of mine.  She was referred by Dr. Dema Severin for asymptomatic bradycardia.  I last saw her in the office 06/02/2020. She does have a history of mild obstructive sleep apnea, treated hypertension hyperlipidemia.  She has a family history of heart disease with a sister and mother both of whom had ischemic heart disease.  She does have anxiety depression medications.  She gets occasional atypical chest pain.  She wears an apple watch and apparently has had some nocturnal bradycardia in the 40s but is asymptomatic from this. ?  ?She did have an event monitor that showed PACs, PVCs and short runs of SVT.  2D echo was essentially normal except for a ascending thoracic aorta measuring 40 mm and a coronary calcium score was 0.   ? ?Since I saw her a year ago she continues to have occasional atypical chest pain and tachypalpitations.  She takes her medications sporadically.  She does have anxiety depression. ? ?Current Meds  ?Medication Sig  ? acetaminophen (TYLENOL) 325 MG tablet Take 325 mg by mouth every 6 (six) hours as needed.  ? Ascorbic Acid (VITAMIN C PO) Take 1 tablet by mouth daily.  ? B Complex Vitamins (VITAMIN B COMPLEX PO) Take 1 tablet by mouth daily.  ? calcium carbonate (TUMS - DOSED IN MG ELEMENTAL CALCIUM) 500 MG chewable tablet Chew 1 tablet by mouth daily. Chewable tablet  ? Cholecalciferol 25 MCG (1000 UT) tablet Take 50,000 Units by mouth once a week.  ? clorazepate (TRANXENE) 3.75 MG tablet Take 3.75 mg by mouth daily as needed.  ? ergocalciferol (VITAMIN D2) 50000 UNITS capsule Take 50,000 Units by mouth once a week.  ? ibuprofen (ADVIL) 600 MG tablet  Take 1 tablet (600 mg total) by mouth every 6 (six) hours as needed.  ? KLOR-CON M20 20 MEQ tablet Take 20 mEq by mouth daily.  ? levothyroxine (SYNTHROID, LEVOTHROID) 50 MCG tablet Take 50 mcg by mouth daily.  ? Multiple Vitamin (MULTIVITAMIN WITH MINERALS) TABS Take 1 tablet by mouth daily.  ? naproxen sodium (ANAPROX) 220 MG tablet Take 220 mg by mouth as needed.   ? neomycin-polymyxin-hydrocortisone (CORTISPORIN) 3.5-10000-1 otic suspension Place 2-5 drops into both ears 4 (four) times daily.  ? omega-3 acid ethyl esters (LOVAZA) 1 g capsule Take 1 capsule by mouth 2 (two) times daily.  ? rosuvastatin (CRESTOR) 10 MG tablet Take 1 tablet (10 mg total) by mouth daily.  ? telmisartan-hydrochlorothiazide (MICARDIS HCT) 80-25 MG tablet Take 1 tablet by mouth daily.  ? TRINTELLIX 5 MG TABS tablet Take 5 mg by mouth daily.  ?  ? ?Allergies  ?Allergen Reactions  ? Buspar [Buspirone] Shortness Of Breath  ? Escitalopram Oxalate Shortness Of Breath  ? Keflex [Cephalexin] Anaphylaxis  ?  headache  ? Mepergan [Meperidine-Promethazine] Other (See Comments)  ?  Causes  Chest  Pain.  ? Remeron [Mirtazapine] Anaphylaxis  ? Risperdal [Risperidone] Anaphylaxis  ? Toprol Xl [Metoprolol Succinate] Shortness Of Breath  ? Altace [Ramipril]   ? Anafranil [Clomipramine Hcl]   ?  Jerking body movements  ? Benicar Hct [Olmesartan Medoxomil-Hctz]   ?  Memory difficulties ?  ? Bupropion  Other (See Comments)  ?  Reaction unknown  ? Cholestatin Other (See Comments)  ?  Reaction unknown  ? Codeine Other (See Comments)  ?  Patient states that doctor told her that she was allergic to this medication.  ? Cymbalta [Duloxetine Hcl]   ?  Worsening depression ?  ? Effexor [Venlafaxine Hydrochloride] Other (See Comments)  ?  Patient states that doctor told her that she was allergic to this medication.  ? Nexium [Esomeprazole Magnesium]   ?  Sensation of throat closing ?  ? Paroxetine Hcl Other (See Comments)  ?  Patient states that doctor told her  that she was allergic to this medication.  ? Protonix [Pantoprazole Sodium]   ? Prozac [Fluoxetine Hcl]   ?  Anger/anxiety  ? Ramipril Other (See Comments)  ?  Patient states that doctor told her that she was allergic to this medication.  ? Sertraline Hcl Other (See Comments)  ?  Patient states that this medication makes her OCD worse.  ? Toprol Xl [Metoprolol Tartrate]   ?  Can't recall ?  ? Prednisone Anxiety  ? ? ?Social History  ? ?Socioeconomic History  ? Marital status: Married  ?  Spouse name: Not on file  ? Number of children: 3  ? Years of education: 55  ? Highest education level: Not on file  ?Occupational History  ? Occupation: unemployed  ?Tobacco Use  ? Smoking status: Former  ?  Packs/day: 0.25  ?  Years: 2.00  ?  Pack years: 0.50  ?  Types: Cigarettes  ?  Quit date: 07/10/1984  ?  Years since quitting: 37.3  ? Smokeless tobacco: Never  ?Substance and Sexual Activity  ? Alcohol use: Yes  ?  Comment: social  ? Drug use: No  ? Sexual activity: Yes  ?  Birth control/protection: None  ?  Comment: husband vasectomy  ?Other Topics Concern  ? Not on file  ?Social History Narrative  ? Not on file  ? ?Social Determinants of Health  ? ?Financial Resource Strain: Not on file  ?Food Insecurity: Not on file  ?Transportation Needs: Not on file  ?Physical Activity: Not on file  ?Stress: Not on file  ?Social Connections: Not on file  ?Intimate Partner Violence: Not on file  ?  ? ?Review of Systems: ?General: negative for chills, fever, night sweats or weight changes.  ?Cardiovascular: negative for chest pain, dyspnea on exertion, edema, orthopnea, palpitations, paroxysmal nocturnal dyspnea or shortness of breath ?Dermatological: negative for rash ?Respiratory: negative for cough or wheezing ?Urologic: negative for hematuria ?Abdominal: negative for nausea, vomiting, diarrhea, bright red blood per rectum, melena, or hematemesis ?Neurologic: negative for visual changes, syncope, or dizziness ?All other systems reviewed  and are otherwise negative except as noted above. ? ? ? ?Blood pressure 132/88, pulse 60, height '5\' 3"'$  (1.6 m), weight 189 lb (85.7 kg).  ?General appearance: alert and no distress ?Neck: no adenopathy, no carotid bruit, no JVD, supple, symmetrical, trachea midline, and thyroid not enlarged, symmetric, no tenderness/mass/nodules ?Lungs: clear to auscultation bilaterally ?Heart: regular rate and rhythm, S1, S2 normal, no murmur, click, rub or gallop ?Extremities: extremities normal, atraumatic, no cyanosis or edema ?Pulses: 2+ and symmetric ?Skin: Skin color, texture, turgor normal. No rashes or lesions ?Neurologic: Grossly normal ? ?EKG sinus rhythm at 60 with borderline LVH and poor R wave progression.  Personally reviewed this EKG. ? ?ASSESSMENT AND PLAN:  ? ?Atypical chest pain ?History of atypical chest pain with a coronary calcium  score of 0. ? ?Hyperlipidemia ?History of hyperlipidemia on Crestor with a lipid profile performed 11/29/2020 revealing total cholesterol 191, LDL 123 HDL 42.  Given her coronary calcium score of 0 I do not feel compelled to increase her Crestor at this time. ? ?Essential hypertension ?History of essential hypertension a blood pressure measured today at 142/90.  Repeat blood pressure at the end of the visit was 132/88.  She is on Micardis/hydrochlorothiazide.  She is not on beta-blocker because she has had issues with bradycardia in the past. ? ? ? ? ?Lorretta Harp MD FACP,FACC,FAHA, FSCAI ?11/15/2021 ?3:55 PM ?

## 2021-11-15 NOTE — Assessment & Plan Note (Signed)
History of hyperlipidemia on Crestor with a lipid profile performed 11/29/2020 revealing total cholesterol 191, LDL 123 HDL 42.  Given her coronary calcium score of 0 I do not feel compelled to increase her Crestor at this time. ?

## 2021-11-15 NOTE — Patient Instructions (Signed)

## 2021-11-15 NOTE — Assessment & Plan Note (Addendum)
History of essential hypertension a blood pressure measured today at 142/90.  Repeat blood pressure at the end of the visit was 132/88.  She is on Micardis/hydrochlorothiazide.  She is not on beta-blocker because she has had issues with bradycardia in the past. ?

## 2021-12-01 DIAGNOSIS — R202 Paresthesia of skin: Secondary | ICD-10-CM | POA: Diagnosis not present

## 2021-12-01 DIAGNOSIS — I10 Essential (primary) hypertension: Secondary | ICD-10-CM | POA: Diagnosis not present

## 2021-12-01 DIAGNOSIS — E559 Vitamin D deficiency, unspecified: Secondary | ICD-10-CM | POA: Diagnosis not present

## 2021-12-01 DIAGNOSIS — R69 Illness, unspecified: Secondary | ICD-10-CM | POA: Diagnosis not present

## 2021-12-01 DIAGNOSIS — E785 Hyperlipidemia, unspecified: Secondary | ICD-10-CM | POA: Diagnosis not present

## 2021-12-06 DIAGNOSIS — R69 Illness, unspecified: Secondary | ICD-10-CM | POA: Diagnosis not present

## 2021-12-06 DIAGNOSIS — F4310 Post-traumatic stress disorder, unspecified: Secondary | ICD-10-CM | POA: Diagnosis not present

## 2021-12-06 DIAGNOSIS — F4000 Agoraphobia, unspecified: Secondary | ICD-10-CM | POA: Diagnosis not present

## 2021-12-06 DIAGNOSIS — G4733 Obstructive sleep apnea (adult) (pediatric): Secondary | ICD-10-CM | POA: Diagnosis not present

## 2021-12-06 DIAGNOSIS — I10 Essential (primary) hypertension: Secondary | ICD-10-CM | POA: Diagnosis not present

## 2022-01-03 ENCOUNTER — Ambulatory Visit: Payer: 59 | Admitting: Cardiovascular Disease

## 2022-01-06 DIAGNOSIS — G4733 Obstructive sleep apnea (adult) (pediatric): Secondary | ICD-10-CM | POA: Diagnosis not present

## 2022-01-06 DIAGNOSIS — I10 Essential (primary) hypertension: Secondary | ICD-10-CM | POA: Diagnosis not present

## 2022-01-13 IMAGING — MG DIGITAL SCREENING BILAT W/ TOMO W/ CAD
8 series · 8 of 24 positions shown · non-contrast
Comparison: Previous exam(s).

CLINICAL DATA: Screening.

EXAM:
DIGITAL SCREENING BILATERAL MAMMOGRAM WITH TOMO AND CAD

[L MLO synth-2D]
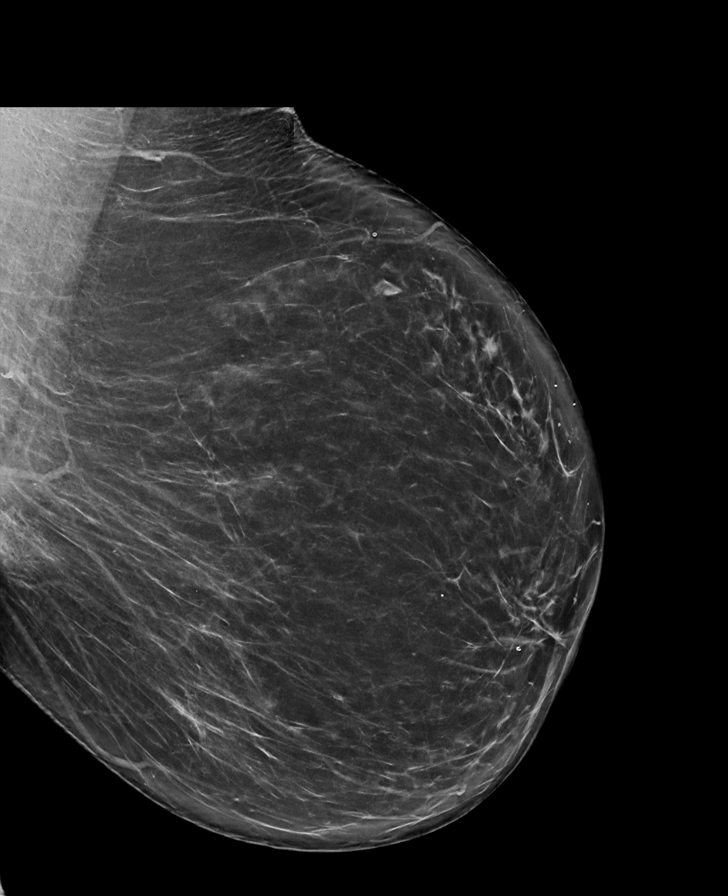

[L CC synth-2D]
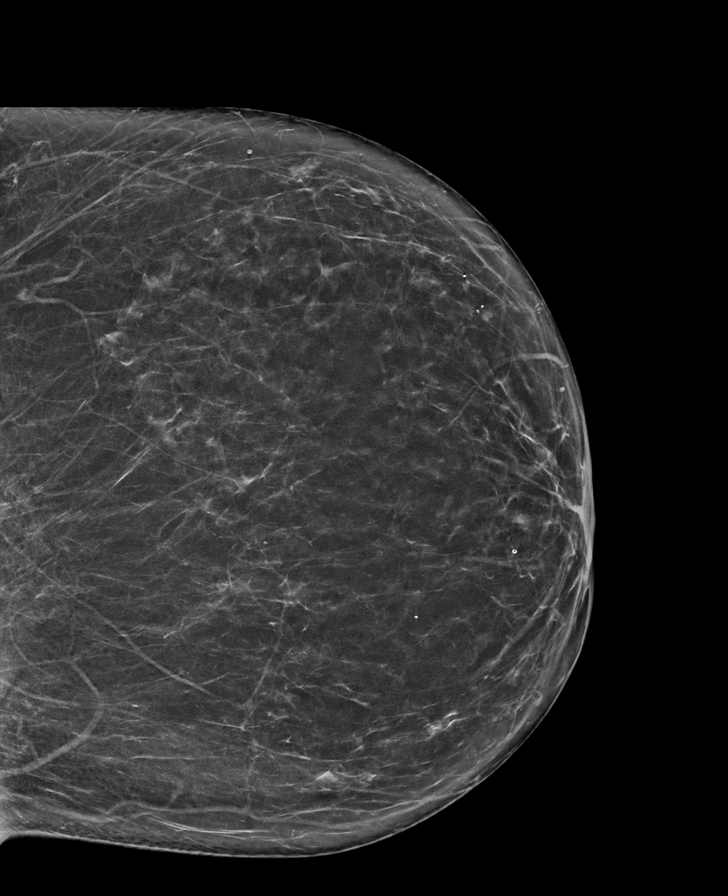

[R MLO synth-2D]
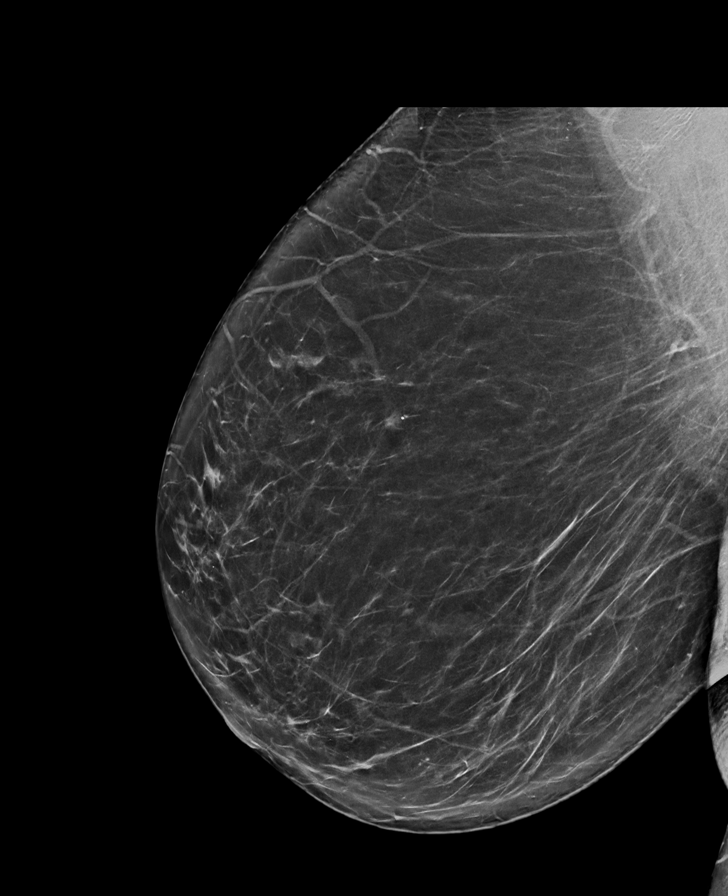

[R CC synth-2D]
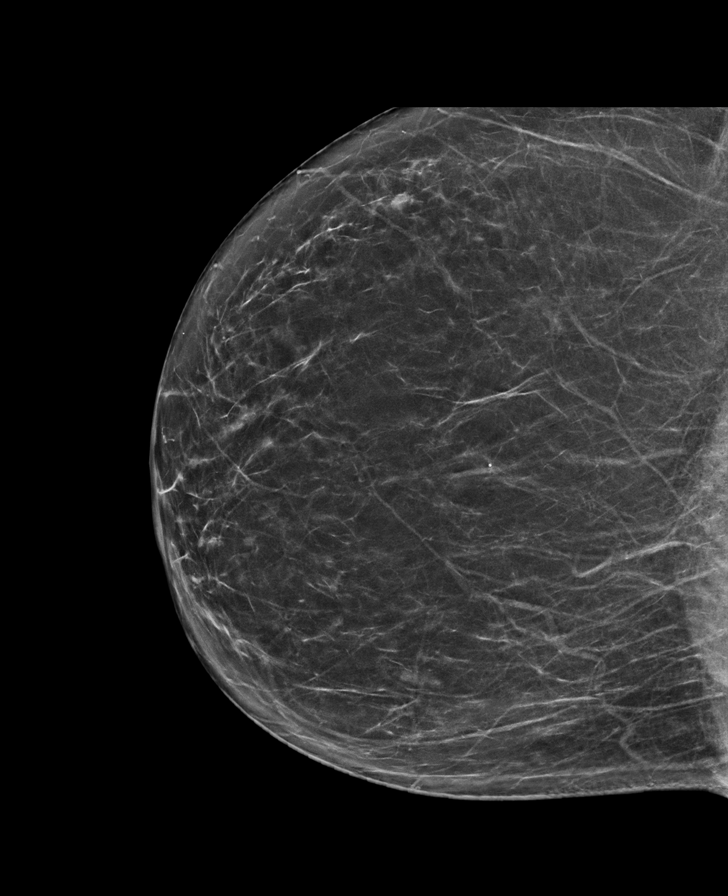

[R MLO tomo · tomo slice 43/84.0]
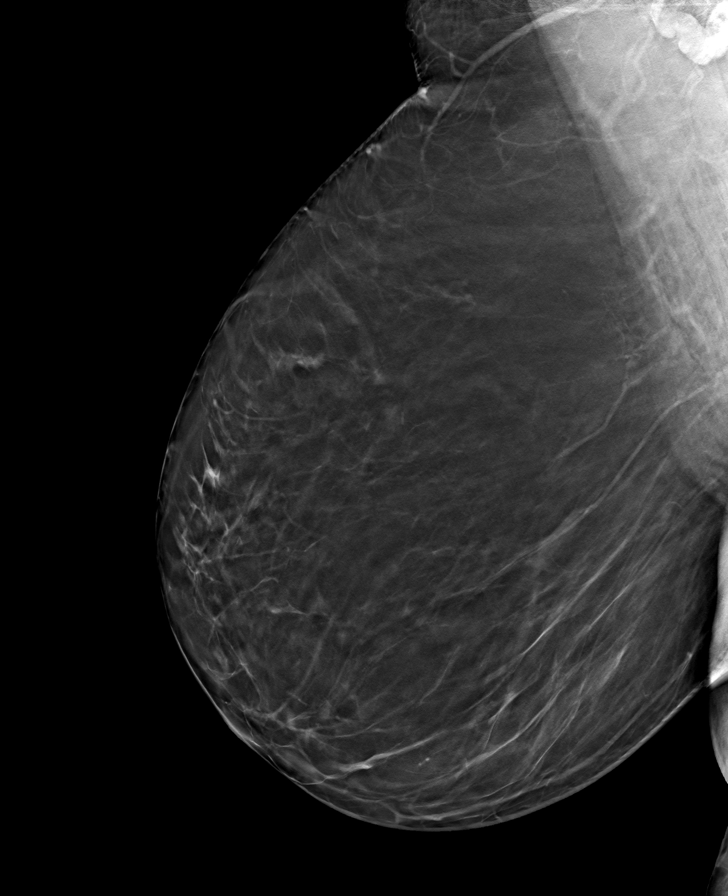

[R CC tomo · tomo slice 37/73.0]
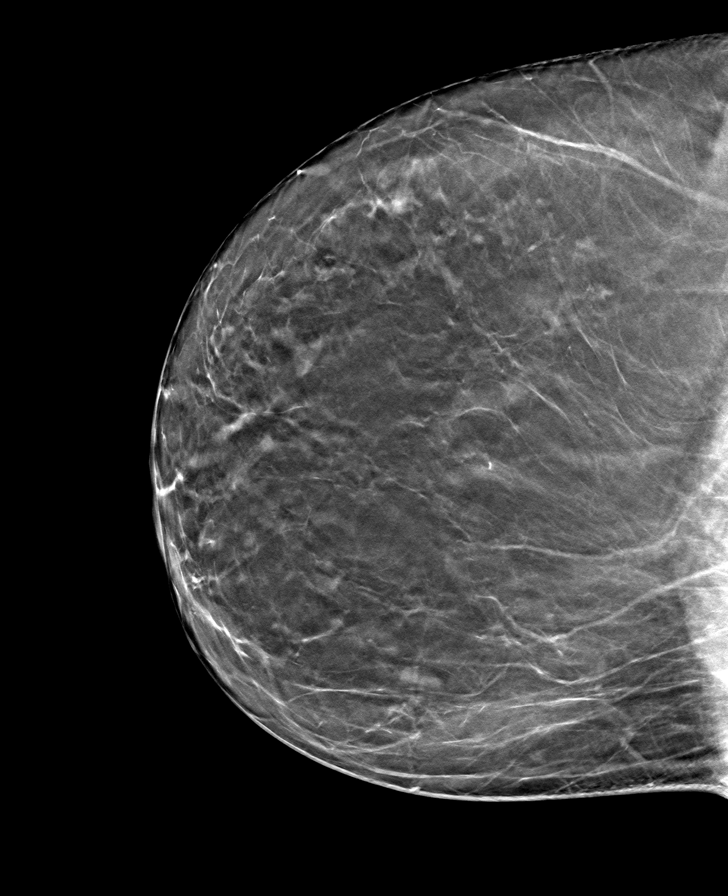

[L MLO tomo · tomo slice 43/85.0]
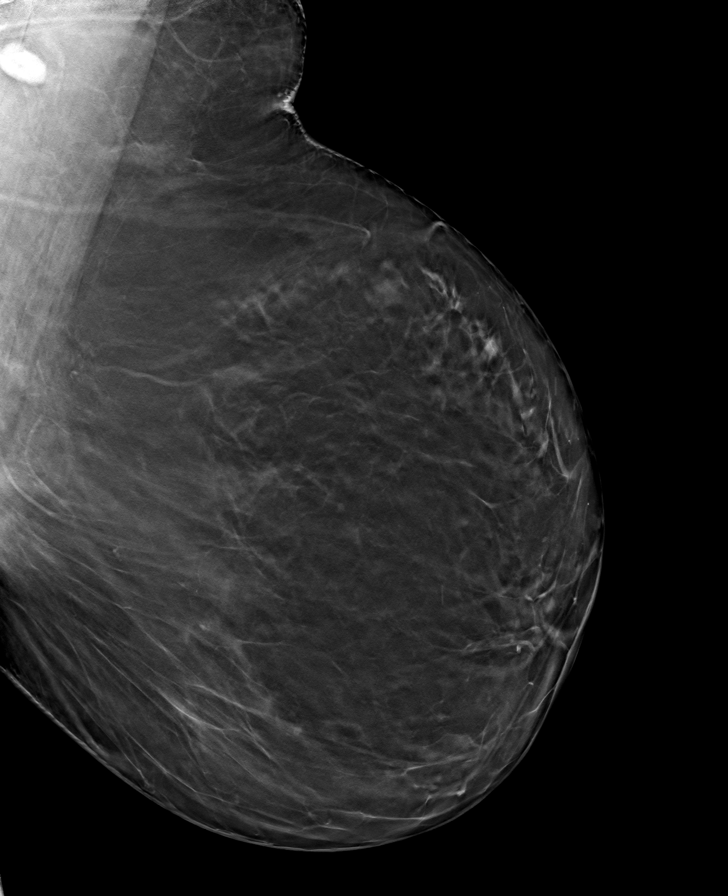

[L CC tomo · tomo slice 37/73.0]
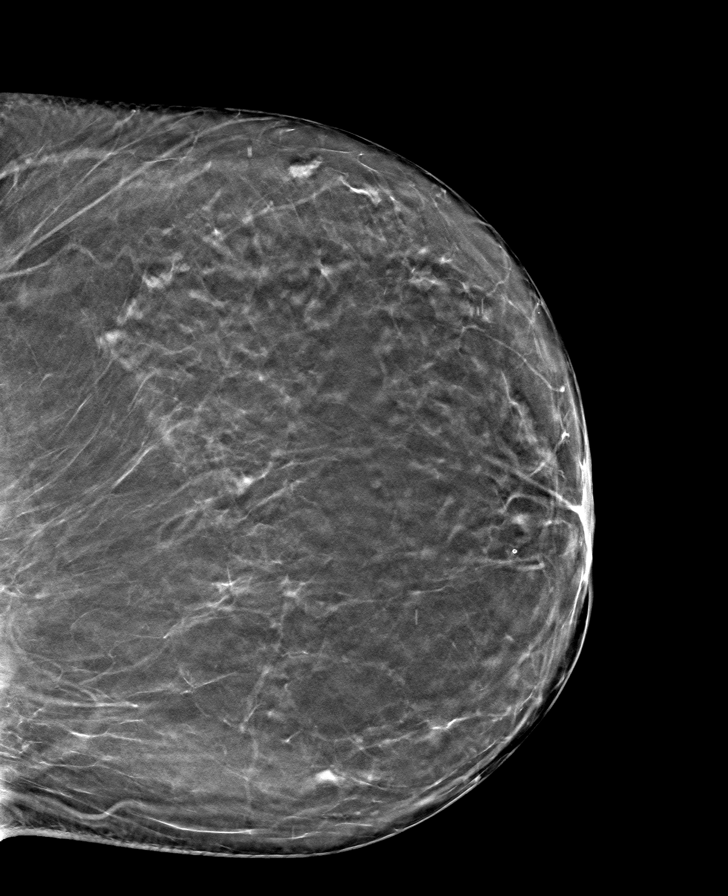

[8 of 24 positions shown; findings below may reference images not displayed]

ACR Breast Density Category b: There are scattered areas of
fibroglandular density.
FINDINGS: There are no findings suspicious for malignancy. Images were
processed with CAD.
IMPRESSION: No mammographic evidence of malignancy. A result letter of this
screening mammogram will be mailed directly to the patient.

RECOMMENDATION:
Screening mammogram in one year. (Code:[id number]-775)

BI-RADS CATEGORY  1: Negative.

## 2022-01-19 DIAGNOSIS — G4733 Obstructive sleep apnea (adult) (pediatric): Secondary | ICD-10-CM | POA: Diagnosis not present

## 2022-01-19 DIAGNOSIS — E039 Hypothyroidism, unspecified: Secondary | ICD-10-CM | POA: Diagnosis not present

## 2022-01-19 DIAGNOSIS — I10 Essential (primary) hypertension: Secondary | ICD-10-CM | POA: Diagnosis not present

## 2022-01-19 DIAGNOSIS — E669 Obesity, unspecified: Secondary | ICD-10-CM | POA: Diagnosis not present

## 2022-01-23 DIAGNOSIS — R69 Illness, unspecified: Secondary | ICD-10-CM | POA: Diagnosis not present

## 2022-01-31 DIAGNOSIS — R69 Illness, unspecified: Secondary | ICD-10-CM | POA: Diagnosis not present

## 2022-02-05 DIAGNOSIS — G4733 Obstructive sleep apnea (adult) (pediatric): Secondary | ICD-10-CM | POA: Diagnosis not present

## 2022-02-05 DIAGNOSIS — I10 Essential (primary) hypertension: Secondary | ICD-10-CM | POA: Diagnosis not present

## 2022-02-08 DIAGNOSIS — R69 Illness, unspecified: Secondary | ICD-10-CM | POA: Diagnosis not present

## 2022-02-14 DIAGNOSIS — R69 Illness, unspecified: Secondary | ICD-10-CM | POA: Diagnosis not present

## 2022-02-22 DIAGNOSIS — R69 Illness, unspecified: Secondary | ICD-10-CM | POA: Diagnosis not present

## 2022-02-28 DIAGNOSIS — R69 Illness, unspecified: Secondary | ICD-10-CM | POA: Diagnosis not present

## 2022-03-08 DIAGNOSIS — I10 Essential (primary) hypertension: Secondary | ICD-10-CM | POA: Diagnosis not present

## 2022-03-08 DIAGNOSIS — G4733 Obstructive sleep apnea (adult) (pediatric): Secondary | ICD-10-CM | POA: Diagnosis not present

## 2022-03-15 DIAGNOSIS — R69 Illness, unspecified: Secondary | ICD-10-CM | POA: Diagnosis not present

## 2022-03-23 DIAGNOSIS — E669 Obesity, unspecified: Secondary | ICD-10-CM | POA: Diagnosis not present

## 2022-03-23 DIAGNOSIS — I10 Essential (primary) hypertension: Secondary | ICD-10-CM | POA: Diagnosis not present

## 2022-03-23 DIAGNOSIS — R69 Illness, unspecified: Secondary | ICD-10-CM | POA: Diagnosis not present

## 2022-03-23 DIAGNOSIS — E039 Hypothyroidism, unspecified: Secondary | ICD-10-CM | POA: Diagnosis not present

## 2022-03-23 DIAGNOSIS — G4733 Obstructive sleep apnea (adult) (pediatric): Secondary | ICD-10-CM | POA: Diagnosis not present

## 2022-03-28 DIAGNOSIS — R69 Illness, unspecified: Secondary | ICD-10-CM | POA: Diagnosis not present

## 2022-04-04 DIAGNOSIS — R69 Illness, unspecified: Secondary | ICD-10-CM | POA: Diagnosis not present

## 2022-04-08 DIAGNOSIS — G4733 Obstructive sleep apnea (adult) (pediatric): Secondary | ICD-10-CM | POA: Diagnosis not present

## 2022-04-08 DIAGNOSIS — I10 Essential (primary) hypertension: Secondary | ICD-10-CM | POA: Diagnosis not present

## 2022-04-17 ENCOUNTER — Emergency Department (HOSPITAL_COMMUNITY)
Admission: EM | Admit: 2022-04-17 | Discharge: 2022-04-17 | Disposition: A | Discharge status: Home / Self Care | Payer: 59 | Attending: Emergency Medicine | Admitting: Emergency Medicine

## 2022-04-17 ENCOUNTER — Emergency Department (HOSPITAL_COMMUNITY): Admission: EM | Admit: 2022-04-17 | Discharge: 2022-04-17 | Discharge status: Against Medical Advice | Payer: 59

## 2022-04-17 ENCOUNTER — Encounter (HOSPITAL_COMMUNITY): Payer: Self-pay | Admitting: Emergency Medicine

## 2022-04-17 DIAGNOSIS — R300 Dysuria: Secondary | ICD-10-CM | POA: Insufficient documentation

## 2022-04-17 DIAGNOSIS — R319 Hematuria, unspecified: Secondary | ICD-10-CM | POA: Diagnosis not present

## 2022-04-17 DIAGNOSIS — N3001 Acute cystitis with hematuria: Secondary | ICD-10-CM

## 2022-04-17 LAB — URINALYSIS, ROUTINE W REFLEX MICROSCOPIC
Bacteria, UA: NONE SEEN
RBC / HPF: >50 RBC/hpf — ABNORMAL HIGH (ref 0–5)

## 2022-04-17 MED ORDER — CIPROFLOXACIN HCL 500 MG PO TABS
500.0000 mg | ORAL_TABLET | Freq: Once | ORAL | Status: AC
  Administered 2022-04-17: 500 mg via ORAL
  Filled 2022-04-17: qty 1

## 2022-04-17 MED ORDER — PHENAZOPYRIDINE HCL 200 MG PO TABS
200.0000 mg | ORAL_TABLET | Freq: Once | ORAL | Status: AC
  Administered 2022-04-17: 200 mg via ORAL
  Filled 2022-04-17: qty 1

## 2022-04-17 MED ORDER — CIPROFLOXACIN HCL 500 MG PO TABS: 500.0000 mg | ORAL_TABLET | Freq: Two times a day (BID) | ORAL | 0 refills | Status: DC

## 2022-04-17 MED ORDER — PHENAZOPYRIDINE HCL 200 MG PO TABS: 200.0000 mg | ORAL_TABLET | Freq: Three times a day (TID) | ORAL | 0 refills | Status: DC

## 2022-04-17 NOTE — ED Triage Notes (Signed)
Pt reports hematuria and burning with urination for the past few hours. Denies fever or back pain. No known hx of UTI.

## 2022-04-17 NOTE — Discharge Instructions (Signed)
Begin taking Cipro and Pyridium as prescribed.  Follow-up with your primary doctor if symptoms are not improving in the next few days, and return to the ER if symptoms significantly worsen or change.

## 2022-04-17 NOTE — ED Notes (Signed)
Pt left. 

## 2022-04-17 NOTE — ED Provider Notes (Signed)
[facility] DEPT Provider Note   CSN: [id number] Arrival date & time: 04/17/22  0220     History  Chief Complaint  Patient presents with   Hematuria    Adrienne Freeman is a 56 y.o. female.  Patient is a 56 year old female with past medical history of anemia, depression, anxiety, hyperlipidemia.  Patient presenting today with complaints of burning with urination and hematuria.  This started 2 days ago and is worsening.  She describes some suprapubic discomfort, but no back pain.  She denies fevers or chills.  There are no aggravating or alleviating factors.  The history is provided by the patient.       Home Medications Prior to Admission medications   Medication Sig Start Date End Date Taking? Authorizing Provider  acetaminophen (TYLENOL) 325 MG tablet Take 325 mg by mouth every 6 (six) hours as needed.    [provider]  Ascorbic Acid (VITAMIN C PO) Take 1 tablet by mouth daily.    [provider]  B Complex Vitamins (VITAMIN B COMPLEX PO) Take 1 tablet by mouth daily.    [provider]  calcium carbonate (TUMS - DOSED IN MG ELEMENTAL CALCIUM) 500 MG chewable tablet Chew 1 tablet by mouth daily. Chewable tablet    [provider]  Cholecalciferol 25 MCG (1000 UT) tablet Take 50,000 Units by mouth once a week.    [provider]  clorazepate (TRANXENE) 3.75 MG tablet Take 3.75 mg by mouth daily as needed. 01/02/20   [provider]  ergocalciferol (VITAMIN D2) 50000 UNITS capsule Take 50,000 Units by mouth once a week.    [provider]  ibuprofen (ADVIL) 600 MG tablet Take 1 tablet (600 mg total) by mouth every 6 (six) hours as needed. 08/08/13   Morris, Megan, DO  KLOR-CON M20 20 MEQ tablet Take 20 mEq by mouth daily. 05/24/20   [provider]  levothyroxine (SYNTHROID, LEVOTHROID) 50 MCG tablet Take 50 mcg by mouth daily.    [provider]  Multiple Vitamin  (MULTIVITAMIN WITH MINERALS) TABS Take 1 tablet by mouth daily.    [provider]  naproxen sodium (ANAPROX) 220 MG tablet Take 220 mg by mouth as needed.     [provider]  neomycin-polymyxin-hydrocortisone (CORTISPORIN) 3.5-10000-1 otic suspension Place 2-5 drops into both ears 4 (four) times daily.    [provider]  omega-3 acid ethyl esters (LOVAZA) 1 g capsule Take 1 capsule by mouth 2 (two) times daily. 01/24/20   [provider]  potassium chloride (KLOR-CON) 10 MEQ tablet Take 20 mEq by mouth daily. 02/28/22   [provider]  rosuvastatin (CRESTOR) 10 MG tablet Take 1 tablet (10 mg total) by mouth daily. 04/19/21   Kroeger, Lorelee Cover., PA-C  telmisartan-hydrochlorothiazide (MICARDIS HCT) 80-25 MG tablet Take 1 tablet by mouth daily. 12/07/19   [provider]  TRINTELLIX 5 MG TABS tablet Take 5 mg by mouth daily. 02/03/20   [provider]      Allergies    Buspar [buspirone], Escitalopram oxalate, Keflex [cephalexin], Mepergan [meperidine-promethazine], Remeron [mirtazapine], Risperdal [risperidone], Toprol xl [metoprolol succinate], Altace [ramipril], Anafranil [clomipramine hcl], Benicar hct [olmesartan medoxomil-hctz], Bupropion, Codeine, Cymbalta [duloxetine hcl], Effexor [venlafaxine hydrochloride], Nexium [esomeprazole magnesium], Octacosanol, Paroxetine hcl, Protonix [pantoprazole sodium], Prozac [fluoxetine hcl], Ramipril, Sertraline hcl, Toprol xl [metoprolol tartrate], and Prednisone    Review of Systems   Review of Systems  All other systems reviewed and are negative.   Physical Exam  Updated Vital Signs BP (!) 101/56   Pulse 81   Temp 97.8 F (36.6 C) (Oral)   Resp 16   SpO2 99%  Physical Exam Vitals and nursing note reviewed.  Constitutional:      General: She is not in acute distress.    Appearance: She is well-developed. She is not diaphoretic.  HENT:     Head: Normocephalic and atraumatic.   Cardiovascular:     Rate and Rhythm: Normal rate and regular rhythm.     Heart sounds: No murmur heard.    No friction rub. No gallop.  Pulmonary:     Effort: Pulmonary effort is normal. No respiratory distress.     Breath sounds: Normal breath sounds. No wheezing.  Abdominal:     General: Bowel sounds are normal. There is no distension.     Palpations: Abdomen is soft.     Tenderness: There is abdominal tenderness. There is no right CVA tenderness, left CVA tenderness, guarding or rebound.     Comments: There is mild tenderness in the suprapubic region.  Musculoskeletal:        General: Normal range of motion.     Cervical back: Normal range of motion and neck supple.  Skin:    General: Skin is warm and dry.  Neurological:     General: No focal deficit present.     Mental Status: She is alert and oriented to person, place, and time.     ED Results / Procedures / Treatments   Labs (all labs ordered are listed, but only abnormal results are displayed) Labs Reviewed  URINALYSIS, ROUTINE W REFLEX MICROSCOPIC    EKG None  Radiology No results found.  Procedures Procedures    Medications Ordered in ED Medications - No data to display  ED Course/ Medical Decision Making/ A&P  Patient is a 56 year old female with history of hyperlipidemia and anemia presenting with complaints of hematuria and dysuria.  This has been worsening for the past 2 days.  She arrives here afebrile and with stable vital signs.  Physical examination is unremarkable with the exception of suprapubic tenderness to palpation.  There are no peritoneal signs.  Urinalysis consistent with a urinary tract infection.  This will be treated with Cipro and Pyridium.  Patient to follow-up as needed if not improving.  Final Clinical Impression(s) / ED Diagnoses Final diagnoses:  None    Rx / DC Orders ED Discharge Orders     None         Veryl Speak, MD 04/17/22 [phone]

## 2022-04-18 DIAGNOSIS — R69 Illness, unspecified: Secondary | ICD-10-CM | POA: Diagnosis not present

## 2022-05-02 DIAGNOSIS — R69 Illness, unspecified: Secondary | ICD-10-CM | POA: Diagnosis not present

## 2022-05-08 DIAGNOSIS — G4733 Obstructive sleep apnea (adult) (pediatric): Secondary | ICD-10-CM | POA: Diagnosis not present

## 2022-05-08 DIAGNOSIS — I10 Essential (primary) hypertension: Secondary | ICD-10-CM | POA: Diagnosis not present

## 2022-05-16 DIAGNOSIS — R69 Illness, unspecified: Secondary | ICD-10-CM | POA: Diagnosis not present

## 2022-05-22 ENCOUNTER — Other Ambulatory Visit: Payer: Self-pay | Admitting: Family Medicine

## 2022-05-22 DIAGNOSIS — Z1231 Encounter for screening mammogram for malignant neoplasm of breast: Secondary | ICD-10-CM

## 2022-05-23 ENCOUNTER — Other Ambulatory Visit: Payer: Self-pay | Admitting: Family Medicine

## 2022-05-23 ENCOUNTER — Ambulatory Visit
Admission: RE | Admit: 2022-05-23 | Discharge: 2022-05-23 | Disposition: A | Discharge status: Home / Self Care | Payer: 59 | Source: Ambulatory Visit | Attending: Family Medicine | Admitting: Family Medicine

## 2022-05-23 DIAGNOSIS — N6320 Unspecified lump in the left breast, unspecified quadrant: Secondary | ICD-10-CM

## 2022-05-23 DIAGNOSIS — Z1231 Encounter for screening mammogram for malignant neoplasm of breast: Secondary | ICD-10-CM

## 2022-05-25 DIAGNOSIS — F4310 Post-traumatic stress disorder, unspecified: Secondary | ICD-10-CM | POA: Diagnosis not present

## 2022-05-25 DIAGNOSIS — F4000 Agoraphobia, unspecified: Secondary | ICD-10-CM | POA: Diagnosis not present

## 2022-05-25 DIAGNOSIS — R69 Illness, unspecified: Secondary | ICD-10-CM | POA: Diagnosis not present

## 2022-05-30 DIAGNOSIS — R69 Illness, unspecified: Secondary | ICD-10-CM | POA: Diagnosis not present

## 2022-06-02 IMAGING — CT CT CARDIAC CORONARY ARTERY CALCIUM SCORE
3 series · 14 of 20 positions shown, 15 images · non-contrast
Comparison: None.
COMPARISON: None.

Addendum:
EXAM:
OVER-READ INTERPRETATION  CT CHEST

The following report is an over-read performed by radiologist Dr.
Yanara Waqar [REDACTED] on 07/05/2020. This
over-read does not include interpretation of cardiac or coronary
anatomy or pathology. The coronary calcium score interpretation by
the cardiologist is attached.
CLINICAL DATA: Risk stratification
Coronary Calcium Score
TECHNIQUE: The patient was scanned on a Siemens Somatom 64 slice scanner. Axial
non-contrast 3 mm slices were carried out through the heart. The
data set was analyzed on a dedicated work station and scored using
the Agatson method.

[Series 2: casc 3.0 bv41 2 bestdiast 72 % · axial · 0.40mm/px · z∈[+62,+143]mm · 4 of 46 slices shown, 5 images]
[im 10/46  vessel]
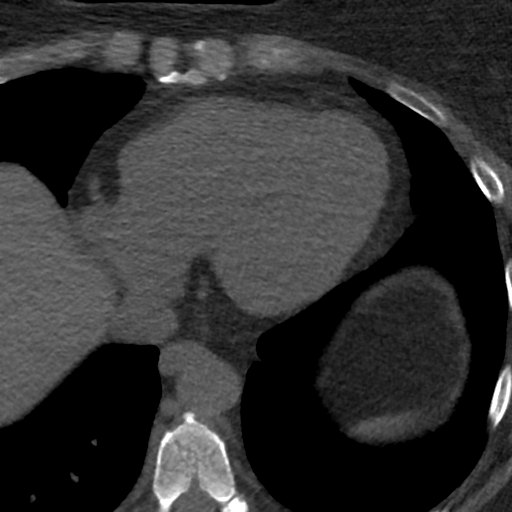
[im 10/46  lung]
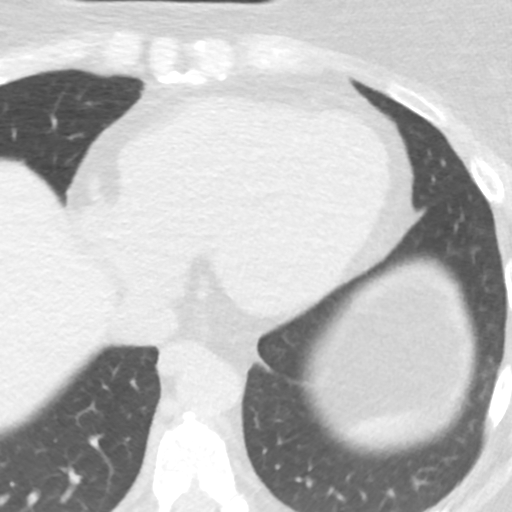
[im 19/46  vessel]
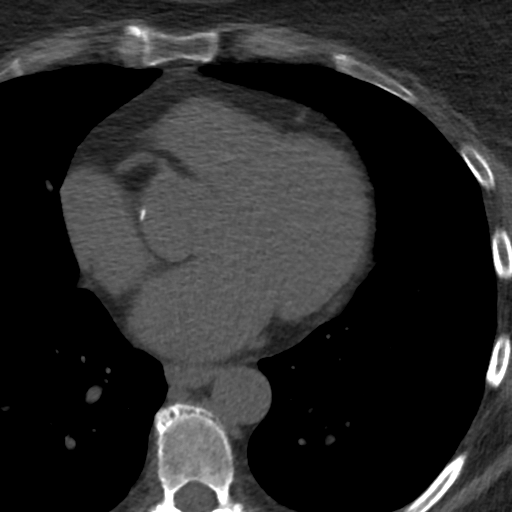
[im 28/46  vessel]
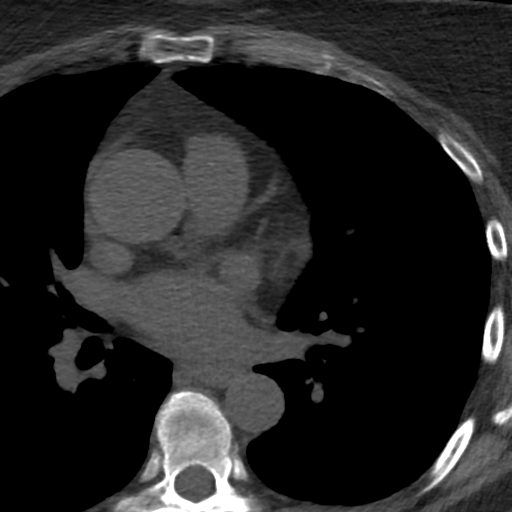
[im 37/46  vessel]
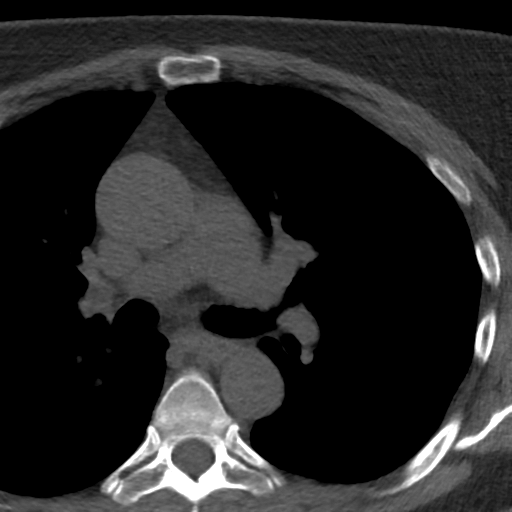

[Series 3: lung 71 % · axial · 0.68mm/px · z∈[+56,+146]mm · 5 of 46 slices shown]
[im 8/46  lung]
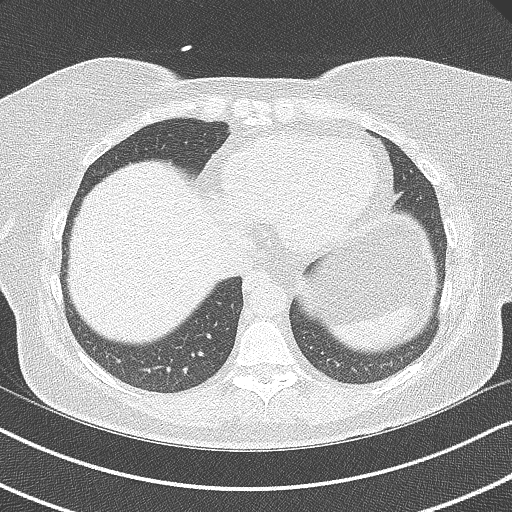
[im 16/46  lung]
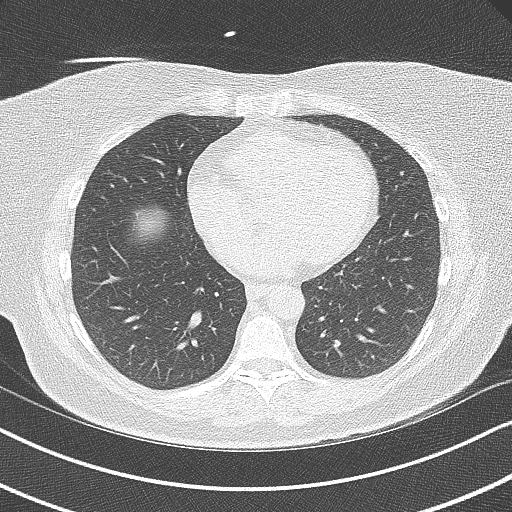
[im 23/46  lung]
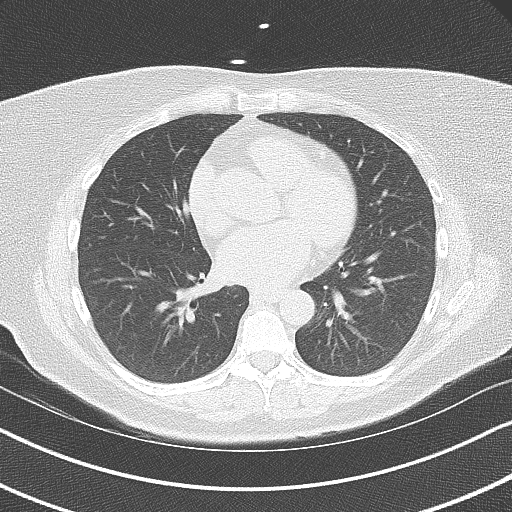
[im 31/46  lung]
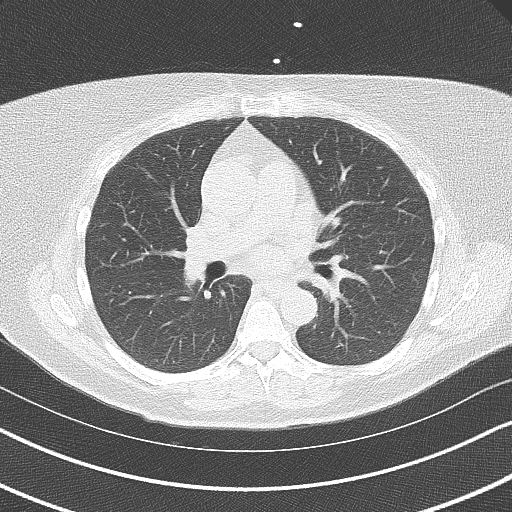
[im 38/46  lung]
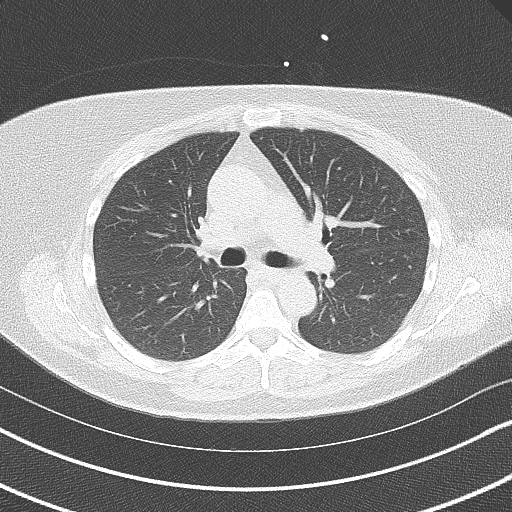

[Series 4: lung st 71 % · axial · 0.68mm/px · z∈[+56,+146]mm · 5 of 46 slices shown]
[im 8/46  lung]
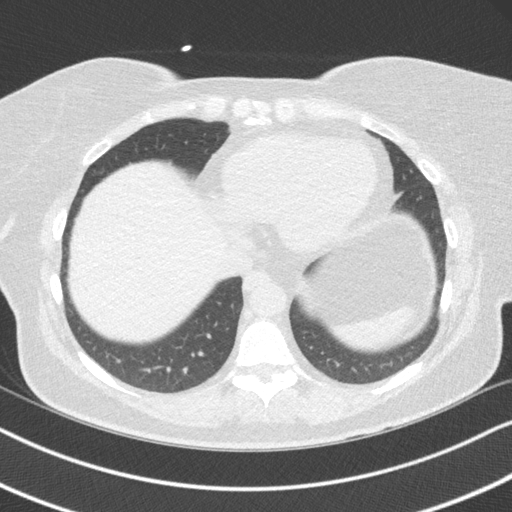
[im 16/46  lung]
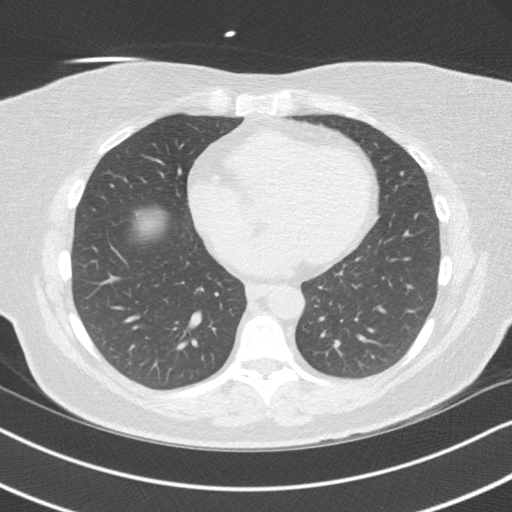
[im 23/46  lung]
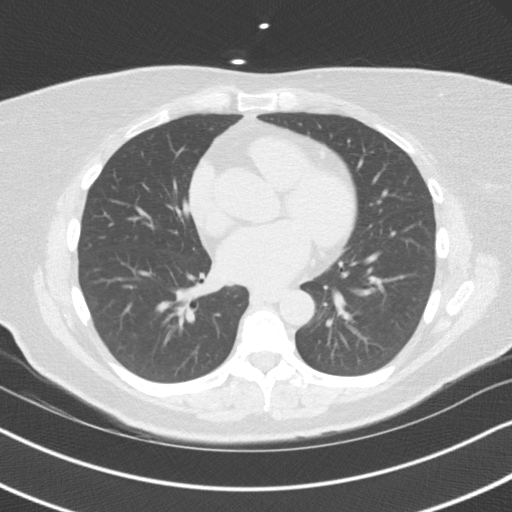
[im 31/46  lung]
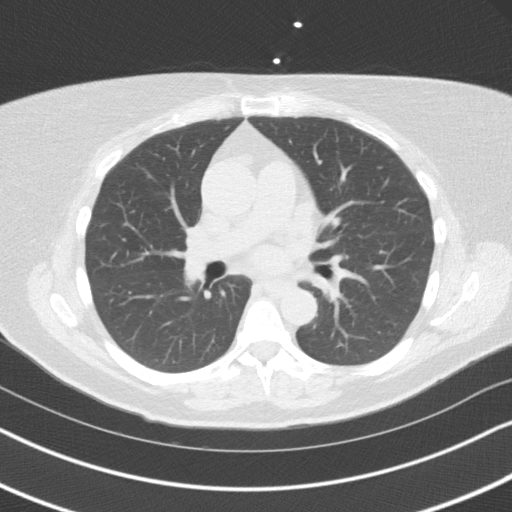
[im 38/46  lung]
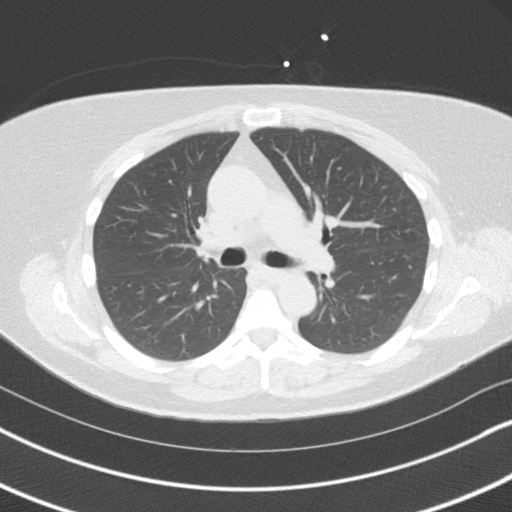

[14 of 20 positions shown; findings below may reference images not displayed]

FINDINGS: Vascular: Heart is normal size. Aorta normal caliber. Punctate
calcification in the aortic root.

Mediastinum/Nodes: No adenopathy.

Lungs/Pleura: Visualized lungs clear.  No effusions.

Upper Abdomen: Imaging into the upper abdomen demonstrates no acute
findings.

Musculoskeletal: Chest wall soft tissues are unremarkable. No acute
bony abnormality.
IMPRESSION: Punctate aortic root calcification.

No acute extra cardiac abnormality
FINDINGS: Non-cardiac: See separate report from [REDACTED].

Ascending aorta: Upper normal to mildly dilated at 38 mm; aortic
atherosclerosis noted

Pericardium: Normal

Coronary arteries: Normal origin
IMPRESSION: Coronary calcium score of 0. This was 0 percentile for age and sex
matched control.

Aortic root upper normal to mildly elevated (38 mm).

Dzidzai Oskido

*** End of Addendum ***
EXAM:
OVER-READ INTERPRETATION  CT CHEST

The following report is an over-read performed by radiologist Dr.
Yanara Waqar [REDACTED] on 07/05/2020. This
over-read does not include interpretation of cardiac or coronary
anatomy or pathology. The coronary calcium score interpretation by
the cardiologist is attached.
FINDINGS: Vascular: Heart is normal size. Aorta normal caliber. Punctate
calcification in the aortic root.

Mediastinum/Nodes: No adenopathy.

Lungs/Pleura: Visualized lungs clear.  No effusions.

Upper Abdomen: Imaging into the upper abdomen demonstrates no acute
findings.

Musculoskeletal: Chest wall soft tissues are unremarkable. No acute
bony abnormality.
IMPRESSION: Punctate aortic root calcification.

No acute extra cardiac abnormality

## 2022-06-06 DIAGNOSIS — E785 Hyperlipidemia, unspecified: Secondary | ICD-10-CM | POA: Diagnosis not present

## 2022-06-07 ENCOUNTER — Ambulatory Visit
Admission: RE | Admit: 2022-06-07 | Discharge: 2022-06-07 | Disposition: A | Discharge status: Home / Self Care | Payer: 59 | Source: Ambulatory Visit | Attending: Family Medicine | Admitting: Family Medicine

## 2022-06-07 ENCOUNTER — Other Ambulatory Visit: Payer: Self-pay | Admitting: Family Medicine

## 2022-06-07 DIAGNOSIS — N6320 Unspecified lump in the left breast, unspecified quadrant: Secondary | ICD-10-CM

## 2022-06-07 DIAGNOSIS — D352 Benign neoplasm of pituitary gland: Secondary | ICD-10-CM

## 2022-06-07 DIAGNOSIS — N644 Mastodynia: Secondary | ICD-10-CM | POA: Diagnosis not present

## 2022-06-08 DIAGNOSIS — G4733 Obstructive sleep apnea (adult) (pediatric): Secondary | ICD-10-CM | POA: Diagnosis not present

## 2022-06-08 DIAGNOSIS — I10 Essential (primary) hypertension: Secondary | ICD-10-CM | POA: Diagnosis not present

## 2022-06-19 DIAGNOSIS — R69 Illness, unspecified: Secondary | ICD-10-CM | POA: Diagnosis not present

## 2022-06-26 DIAGNOSIS — R69 Illness, unspecified: Secondary | ICD-10-CM | POA: Diagnosis not present

## 2022-07-05 ENCOUNTER — Other Ambulatory Visit: Payer: 59

## 2022-07-14 ENCOUNTER — Encounter: Payer: Self-pay | Admitting: Hematology and Oncology

## 2022-07-17 ENCOUNTER — Other Ambulatory Visit: Payer: 59

## 2022-08-07 DIAGNOSIS — F4000 Agoraphobia, unspecified: Secondary | ICD-10-CM | POA: Diagnosis not present

## 2022-08-07 DIAGNOSIS — R69 Illness, unspecified: Secondary | ICD-10-CM | POA: Diagnosis not present

## 2022-08-07 DIAGNOSIS — F4310 Post-traumatic stress disorder, unspecified: Secondary | ICD-10-CM | POA: Diagnosis not present

## 2022-09-15 ENCOUNTER — Ambulatory Visit (HOSPITAL_BASED_OUTPATIENT_CLINIC_OR_DEPARTMENT_OTHER): Payer: PRIVATE HEALTH INSURANCE | Admitting: Internal Medicine

## 2022-09-15 ENCOUNTER — Encounter: Payer: Self-pay | Admitting: Hematology and Oncology

## 2022-09-15 ENCOUNTER — Encounter (HOSPITAL_BASED_OUTPATIENT_CLINIC_OR_DEPARTMENT_OTHER): Payer: Self-pay | Admitting: Internal Medicine

## 2022-09-15 VITALS — BP 149/81 | HR 56 | Ht 63.0 in | Wt 192.0 lb

## 2022-09-15 DIAGNOSIS — E785 Hyperlipidemia, unspecified: Secondary | ICD-10-CM | POA: Diagnosis not present

## 2022-09-15 DIAGNOSIS — M791 Myalgia, unspecified site: Secondary | ICD-10-CM

## 2022-09-15 DIAGNOSIS — T466X5A Adverse effect of antihyperlipidemic and antiarteriosclerotic drugs, initial encounter: Secondary | ICD-10-CM | POA: Diagnosis not present

## 2022-09-15 MED ORDER — EZETIMIBE 10 MG PO TABS: 10.0000 mg | ORAL_TABLET | Freq: Every day | ORAL | 3 refills | Status: DC

## 2022-09-15 MED ORDER — OMEGA-3-ACID ETHYL ESTERS 1 G PO CAPS: 2.0000 g | ORAL_CAPSULE | Freq: Two times a day (BID) | ORAL | 3 refills | Status: DC

## 2022-09-15 NOTE — Progress Notes (Signed)
LIPID CLINIC CONSULT NOTE  Chief Complaint:  Manage dyslipidemia  Primary Care Physician: Harlan Stains, MD  Primary Cardiologist:  Quay Burow, MD  HPI:  Adrienne Freeman is a 57 y.o. female who is being seen today for the evaluation of dyslipidemia at the request of Harlan Stains, MD. this is a pleasant 57 year old female Referred for evaluation management of dyslipidemia.  She is previously seen Dr. Gwenlyn Found for workup of bradycardia and palpitations.  She has been noted to have dyslipidemia in the past.  Unfortunately she has been hesitant to take statin medications.  Most recently her total cholesterol was 347 with triglycerides of 355.  LDL has been in the 150 range.  She is on Lovaza 1 g twice daily.  The good news is she had a negative calcium score in 2021 which is 0.  There is, however heart disease in her family including her father.  PMHx:  Past Medical History:  Diagnosis Date   Anemia    Anemia    Anxiety    Cancer ([place]) 01/2006   Left lower extremity malignant melanoma.   Depression    Fibroids    Uterine   GERD (gastroesophageal reflux disease)    no meds - diet controlled   Hypercholesterolemia    Hypertension    Hypothyroidism    OCD (obsessive compulsive disorder)    Sleep apnea    does not use CPAP every night   SVD (spontaneous vaginal delivery)    x 3   Thyroid disorder    Chronic lymphocytic thyroiditis.   Uterine polyp     Past Surgical History:  Procedure Laterality Date   ABLATION     DIAGNOSTIC LAPAROSCOPY     fibroids removed   DILATION AND CURETTAGE OF UTERUS     DOPPLER ECHOCARDIOGRAPHY  01/30/11   LVEF>70%. Mild concentric LVH. Normal LA size. mild MR. Trace TR. Normal RVSP.   HYSTEROSCOPY WITH D & C N/A 08/08/2013   Procedure: DILATATION AND CURETTAGE /HYSTEROSCOPY;  Surgeon: Linda Hedges, DO;  Location: [facility];  Service: Gynecology;  Laterality: N/A;   MELANOMA EXCISION  2007   T1a melanoma from calf   WISDOM TOOTH  EXTRACTION      FAMHx:  Family History  Problem Relation Age of Onset   Hypertension Mother    OCD Mother    Heart disease Father    Hypertension Father    Colon polyps Father    Alcohol abuse Sister    OCD Maternal Aunt    Pancreatic cancer Other        grandmother??    SOCHx:   reports that she quit smoking about 38 years ago. Her smoking use included cigarettes. She has a 0.50 pack-year smoking history. She has never used smokeless tobacco. She reports current alcohol use. She reports that she does not use drugs.  ALLERGIES:  Allergies  Allergen Reactions   Buspar [Buspirone] Shortness Of Breath   Escitalopram Oxalate Shortness Of Breath   Keflex [Cephalexin] Anaphylaxis    headache   Mepergan [Meperidine-Promethazine] Other (See Comments)    Causes  Chest  Pain.   Remeron [Mirtazapine] Anaphylaxis   Risperdal [Risperidone] Anaphylaxis   Toprol Xl [Metoprolol Succinate] Shortness Of Breath   Altace [Ramipril]    Anafranil [Clomipramine Hcl]     Jerking body movements   Benicar Hct [Olmesartan Medoxomil-Hctz]     Memory difficulties    Bupropion Other (See Comments)    Reaction unknown   Codeine Other (See  Comments)    Patient states that doctor told her that she was allergic to this medication.   Cymbalta [Duloxetine Hcl]     Worsening depression    Effexor [Venlafaxine Hydrochloride] Other (See Comments)    Patient states that doctor told her that she was allergic to this medication.   Nexium [Esomeprazole Magnesium]     Sensation of throat closing    Octacosanol Other (See Comments)    Reaction unknown   Paroxetine Hcl Other (See Comments)    Patient states that doctor told her that she was allergic to this medication.   Protonix [Pantoprazole Sodium]    Prozac [Fluoxetine Hcl]     Anger/anxiety   Ramipril Other (See Comments)    Patient states that doctor told her that she was allergic to this medication.   Sertraline Hcl Other (See Comments)     Patient states that this medication makes her OCD worse.   Toprol Xl [Metoprolol Tartrate]     Can't recall    Prednisone Anxiety    ROS: Pertinent items noted in HPI and remainder of comprehensive ROS otherwise negative.  HOME MEDS: Current Outpatient Medications on File Prior to Visit  Medication Sig Dispense Refill   acetaminophen (TYLENOL) 325 MG tablet Take 325 mg by mouth every 6 (six) hours as needed.     Ascorbic Acid (VITAMIN C PO) Take 1 tablet by mouth daily.     B Complex Vitamins (VITAMIN B COMPLEX PO) Take 1 tablet by mouth daily.     calcium carbonate (TUMS - DOSED IN MG ELEMENTAL CALCIUM) 500 MG chewable tablet Chew 1 tablet by mouth daily. Chewable tablet     citalopram (CELEXA) 20 MG tablet Take 10 mg by mouth daily.     ergocalciferol (VITAMIN D2) 50000 UNITS capsule Take 50,000 Units by mouth once a week.     KLOR-CON M20 20 MEQ tablet Take 20 mEq by mouth daily.     levothyroxine (SYNTHROID, LEVOTHROID) 50 MCG tablet Take 50 mcg by mouth daily.     LORazepam (ATIVAN) 0.5 MG tablet Take 0.5 mg by mouth every 6 (six) hours as needed.     Multiple Vitamin (MULTIVITAMIN WITH MINERALS) TABS Take 1 tablet by mouth daily.     telmisartan-hydrochlorothiazide (MICARDIS HCT) 80-25 MG tablet Take 1 tablet by mouth daily.     No current facility-administered medications on file prior to visit.    LABS/IMAGING: No results found for this or any previous visit (from the past 48 hour(s)). No results found.  LIPID PANEL: No results found for: "CHOL", "TRIG", "HDL", "CHOLHDL", "VLDL", "LDLCALC", "LDLDIRECT"  WEIGHTS: Wt Readings from Last 3 Encounters:  09/15/22 192 lb (87.1 kg)  11/15/21 189 lb (85.7 kg)  04/27/21 199 lb (90.3 kg)    VITALS: BP (!) 149/81   Pulse (!) 56   Ht '5\' 3"'$  (1.6 m)   Wt 192 lb (87.1 kg)   BMI 34.01 kg/m   EXAM: Deferred  EKG: Deferred  ASSESSMENT: Mixed dyslipidemia, goal LDL less than 100 High triglycerides Family history of  heart disease 0 CAC score (2021) Statin hesitant/intolerance-myalgias  PLAN: 1.   [name] is hesitant to try additional statins.  She is previously tried Crestor and another statin in the past, having caused her myalgias and gives her concern about memory issues.  I proposed starting ezetimibe for some modest improvement in her lipids in combination with lifestyle dietary modifications.  She has also been on Lovaza 1 g twice daily.  I advised her to increase that to 2 g twice daily.  She was [facility] but was not able to make an appointment due to a long wait list.  Will again try to see if we can get her established with that per her request.  Plan repeat lipids in about 3 to 4 months and follow-up with me at that time.  Pixie Casino, MD, [facility], [place] Director of the Advanced Lipid Disorders &  Cardiovascular Risk Reduction Clinic Diplomate of the American Board of Clinical Lipidology Attending Cardiologist  Direct Dial: [phone]  Fax: [phone]  Website:  [email or web address] 09/15/2022, 12:55 PM

## 2022-09-15 NOTE — Patient Instructions (Signed)
Medication Instructions:  START- Zetia 10 mg by mouth daily INCREASE- Lovaza take 2 capsule by mouth twice a day  *If you need a refill on your cardiac medications before your next appointment, please call your pharmacy*   Lab Work: Fasting Lipids in 6 Months  If you have labs (blood work) drawn today and your tests are completely normal, you will receive your results only by: [place] (if you have MyChart) OR A paper copy in the mail If you have any lab test that is abnormal or we need to change your treatment, we will call you to review the results.   Testing/Procedures: None Ordered   Follow-Up: At [facility], you and your health needs are our priority.  As part of our continuing mission to provide you with exceptional heart care, we have created designated Provider Care Teams.  These Care Teams include your primary Cardiologist (physician) and Advanced Practice Providers (APPs -  Physician Assistants and Nurse Practitioners) who all work together to provide you with the care you need, when you need it.  We recommend signing up for the patient portal called "MyChart".  Sign up information is provided on this After Visit Summary.  MyChart is used to connect with patients for Virtual Visits (Telemedicine).  Patients are able to view lab/test results, encounter notes, upcoming appointments, etc.  Non-urgent messages can be sent to your provider as well.   To learn more about what you can do with MyChart, go to [email or web address].    Your next appointment:    Dr. Debara Pickett recommends that you schedule a follow up visit with him the in the [facility] in 6 months. Please have fasting blood work about 1 week prior to this visit and he will review the blood work results with you at your appointment.

## 2022-12-12 ENCOUNTER — Ambulatory Visit (HOSPITAL_BASED_OUTPATIENT_CLINIC_OR_DEPARTMENT_OTHER): Payer: 59 | Admitting: Internal Medicine

## 2022-12-18 ENCOUNTER — Encounter: Payer: Self-pay | Admitting: Family Medicine

## 2023-03-13 ENCOUNTER — Encounter: Payer: Self-pay | Admitting: Hematology and Oncology

## 2023-03-20 ENCOUNTER — Encounter (HOSPITAL_BASED_OUTPATIENT_CLINIC_OR_DEPARTMENT_OTHER): Payer: PRIVATE HEALTH INSURANCE | Admitting: Internal Medicine

## 2023-04-06 ENCOUNTER — Encounter (HOSPITAL_BASED_OUTPATIENT_CLINIC_OR_DEPARTMENT_OTHER): Payer: Self-pay | Admitting: Internal Medicine

## 2023-04-06 ENCOUNTER — Encounter: Payer: Self-pay | Admitting: Hematology and Oncology

## 2023-04-06 ENCOUNTER — Ambulatory Visit (HOSPITAL_BASED_OUTPATIENT_CLINIC_OR_DEPARTMENT_OTHER): Payer: PRIVATE HEALTH INSURANCE | Admitting: Internal Medicine

## 2023-04-06 VITALS — BP 142/92 | HR 64 | Ht 63.0 in | Wt 188.0 lb

## 2023-04-06 DIAGNOSIS — E785 Hyperlipidemia, unspecified: Secondary | ICD-10-CM

## 2023-04-06 NOTE — Progress Notes (Signed)
LIPID CLINIC CONSULT NOTE  Chief Complaint:  Manage dyslipidemia  Primary Care Physician: Laurann Montana, MD  Primary Cardiologist:  Nanetta Batty, MD  HPI:  Adrienne Freeman is a 57 y.o. female who is being seen today for the evaluation of dyslipidemia at the request of Laurann Montana, MD. this is a pleasant 57 year old female Referred for evaluation management of dyslipidemia.  She is previously seen Dr. Allyson Sabal for workup of bradycardia and palpitations.  She has been noted to have dyslipidemia in the past.  Unfortunately she has been hesitant to take statin medications.  Most recently her total cholesterol was 347 with triglycerides of 355.  LDL has been in the 150 range.  She is on Lovaza 1 g twice daily.  The good news is she had a negative calcium score in 2021 which is 0.  There is, however heart disease in her family including her father.  04/06/2023  Adrienne Freeman is seen today in follow-up.  She has numerous concerns today.  She had a lot of questions about different issues including her psychiatric medications, palpitations, cholesterol, possible side effects of her ezetimibe, etc.  She has been having some reflux symptoms which could be related to that.  I encouraged her to monitor that we may need to consider stopping it.  She has had some improvement in her triglycerides which are now near normal at 156 however her calculated LDL is slightly higher at 253.  Total cholesterol is come down to 238.  PMHx:  Past Medical History:  Diagnosis Date   Anemia    Anemia    Anxiety    Cancer ([facility]) 01/2006   Left lower extremity malignant melanoma.   Depression    Fibroids    Uterine   GERD (gastroesophageal reflux disease)    no meds - diet controlled   Hypercholesterolemia    Hypertension    Hypothyroidism    OCD (obsessive compulsive disorder)    Sleep apnea    does not use CPAP every night   SVD (spontaneous vaginal delivery)    x 3   Thyroid disorder    Chronic  lymphocytic thyroiditis.   Uterine polyp     Past Surgical History:  Procedure Laterality Date   ABLATION     DIAGNOSTIC LAPAROSCOPY     fibroids removed   DILATION AND CURETTAGE OF UTERUS     DOPPLER ECHOCARDIOGRAPHY  01/30/11   LVEF>70%. Mild concentric LVH. Normal LA size. mild MR. Trace TR. Normal RVSP.   HYSTEROSCOPY WITH D & C N/A 08/08/2013   Procedure: DILATATION AND CURETTAGE /HYSTEROSCOPY;  Surgeon: Mitchel Honour, DO;  Location: [facility];  Service: Gynecology;  Laterality: N/A;   MELANOMA EXCISION  2007   T1a melanoma from calf   WISDOM TOOTH EXTRACTION      FAMHx:  Family History  Problem Relation Age of Onset   Hypertension Mother    OCD Mother    Heart disease Father    Hypertension Father    Colon polyps Father    Alcohol abuse Sister    OCD Maternal Aunt    Pancreatic cancer Other        grandmother??    SOCHx:   reports that she quit smoking about 38 years ago. Her smoking use included cigarettes. She started smoking about 40 years ago. She has a 0.5 pack-year smoking history. She has never used smokeless tobacco. She reports current alcohol use. She reports that she does not use drugs.  ALLERGIES:  Allergies  Allergen Reactions   Buspar [Buspirone] Shortness Of Breath   Escitalopram Oxalate Shortness Of Breath   Keflex [Cephalexin] Anaphylaxis    headache   Mepergan [Meperidine-Promethazine] Other (See Comments)    Causes  Chest  Pain.   Remeron [Mirtazapine] Anaphylaxis   Risperdal [Risperidone] Anaphylaxis   Toprol Xl [Metoprolol Succinate] Shortness Of Breath   Altace [Ramipril]    Anafranil [Clomipramine Hcl]     Jerking body movements   Benicar Hct [Olmesartan Medoxomil-Hctz]     Memory difficulties    Bupropion Other (See Comments)    Reaction unknown   Codeine Other (See Comments)    Patient states that doctor told her that she was allergic to this medication.   Cymbalta [Duloxetine Hcl]     Worsening depression    Effexor [Venlafaxine  Hydrochloride] Other (See Comments)    Patient states that doctor told her that she was allergic to this medication.   Nexium [Esomeprazole Magnesium]     Sensation of throat closing    Octacosanol Other (See Comments)    Reaction unknown   Paroxetine Hcl Other (See Comments)    Patient states that doctor told her that she was allergic to this medication.   Protonix [Pantoprazole Sodium]    Prozac [Fluoxetine Hcl]     Anger/anxiety   Ramipril Other (See Comments)    Patient states that doctor told her that she was allergic to this medication.   Sertraline Hcl Other (See Comments)    Patient states that this medication makes her OCD worse.   Toprol Xl [Metoprolol Tartrate]     Can't recall    Prednisone Anxiety    ROS: Pertinent items noted in HPI and remainder of comprehensive ROS otherwise negative.  HOME MEDS: Current Outpatient Medications on File Prior to Visit  Medication Sig Dispense Refill   acetaminophen (TYLENOL) 325 MG tablet Take 325 mg by mouth every 6 (six) hours as needed.     ARIPiprazole (ABILIFY) 2 MG tablet Take 2 mg by mouth every morning.     calcium carbonate (TUMS - DOSED IN MG ELEMENTAL CALCIUM) 500 MG chewable tablet Chew 1 tablet by mouth daily. Chewable tablet     citalopram (CELEXA) 20 MG tablet Take 10 mg by mouth daily.     ergocalciferol (VITAMIN D2) 50000 UNITS capsule Take 50,000 Units by mouth once a week.     ezetimibe (ZETIA) 10 MG tablet Take 1 tablet (10 mg total) by mouth daily. 90 tablet 3   levothyroxine (SYNTHROID, LEVOTHROID) 50 MCG tablet Take 50 mcg by mouth daily.     LORazepam (ATIVAN) 0.5 MG tablet Take 0.5 mg by mouth every 6 (six) hours as needed.     Multiple Vitamin (MULTIVITAMIN WITH MINERALS) TABS Take 1 tablet by mouth daily.     omega-3 acid ethyl esters (LOVAZA) 1 g capsule Take 2 capsules (2 g total) by mouth 2 (two) times daily. 360 capsule 3   potassium chloride (KLOR-CON) 10 MEQ tablet Take 20 mEq by mouth daily.      telmisartan-hydrochlorothiazide (MICARDIS HCT) 80-25 MG tablet Take 1 tablet by mouth daily.     Ascorbic Acid (VITAMIN C PO) Take 1 tablet by mouth daily. (Patient not taking: Reported on 04/06/2023)     B Complex Vitamins (VITAMIN B COMPLEX PO) Take 1 tablet by mouth daily. (Patient not taking: Reported on 04/06/2023)     tretinoin (RETIN-A) 0.025 % cream SMARTSIG:1 Application Topical Every Evening (Patient not taking: Reported on 04/06/2023)  No current facility-administered medications on file prior to visit.    LABS/IMAGING: No results found for this or any previous visit (from the past 48 hour(s)). No results found.  LIPID PANEL:    Component Value Date/Time   CHOL 238 (H) 03/14/2023 0859   TRIG 156 (H) 03/14/2023 0859   HDL 45 03/14/2023 0859   CHOLHDL 5.3 (H) 03/14/2023 0859   LDLCALC 164 (H) 03/14/2023 0859    WEIGHTS: Wt Readings from Last 3 Encounters:  04/06/23 188 lb (85.3 kg)  09/15/22 192 lb (87.1 kg)  11/15/21 189 lb (85.7 kg)    VITALS: BP (!) 168/92   Pulse 64   Ht 5\' 3"  (1.6 m)   Wt 188 lb (85.3 kg)   SpO2 96%   BMI 33.30 kg/m   EXAM: Deferred  EKG: Deferred  ASSESSMENT: Mixed dyslipidemia, goal LDL less than 100 High triglycerides Family history of heart disease 0 CAC score (2021) Statin hesitant/intolerance-myalgias  PLAN: 1.   Adrienne Freeman has had some further improvement in her cholesterol with an improvement in her triglycerides.  She is also reported to be more active.  Her LDL is a little higher, I think from adjustment in her triglycerides.  In addition she is on the Lovaza which is an intake of saturated fats.  Her target LDL is less than 70.  She still could benefit from low-dose statin however she is hesitant to add additional medications at this time.  I would advise continued monitoring of her medications and no changes at this time.  Will repeat lipids in about 6 months.  Would consider low-dose statin if not at target or if she has  to stop zetia.  Chrystie Nose, MD, [facility], FACP    [facility]  Medical Director of the Advanced Lipid Disorders &  Cardiovascular Risk Reduction Clinic Diplomate of the American Board of Clinical Lipidology Attending Cardiologist  Direct Dial: [phone]  Fax: [phone]  Website:  [email or web address] 04/06/2023, 4:15 PM

## 2023-04-06 NOTE — Patient Instructions (Signed)
Medication Instructions:  NO CHANGES  *If you need a refill on your cardiac medications before your next appointment, please call your pharmacy*   Lab Work: FASTING lab work in 6 months   If you have labs (blood work) drawn today and your tests are completely normal, you will receive your results only by: [employer] (if you have MyChart) OR A paper copy in the mail If you have any lab test that is abnormal or we need to change your treatment, we will call you to review the results.   Follow-Up: At [facility], you and your health needs are our priority.  As part of our continuing mission to provide you with exceptional heart care, we have created designated Provider Care Teams.  These Care Teams include your primary Cardiologist (physician) and Advanced Practice Providers (APPs -  Physician Assistants and Nurse Practitioners) who all work together to provide you with the care you need, when you need it.  We recommend signing up for the patient portal called "MyChart".  Sign up information is provided on this After Visit Summary.  MyChart is used to connect with patients for Virtual Visits (Telemedicine).  Patients are able to view lab/test results, encounter notes, upcoming appointments, etc.  Non-urgent messages can be sent to your provider as well.   To learn more about what you can do with MyChart, go to [email or web address].    Your next appointment:    6 months with Dr. Rennis Golden

## 2023-04-20 ENCOUNTER — Telehealth: Payer: Self-pay | Admitting: Cardiovascular Disease

## 2023-04-20 MED ORDER — ROSUVASTATIN CALCIUM 5 MG PO TABS: 5.0000 mg | ORAL_TABLET | Freq: Every evening | ORAL | 3 refills | Status: AC

## 2023-04-20 NOTE — Telephone Encounter (Signed)
Called pt back she states the Zetia is causing acid reflux and is still "messing with her stomach". "He told me I could call back if I was still having issues. I think I would like to take the Rosuvastatin again since it did lower my cholesterol." Pt would like the prescription sent in to [facility].

## 2023-04-20 NOTE — Telephone Encounter (Signed)
Called pt to relay Dr. Blanchie Dessert message. Pt is thankful for the call.

## 2023-04-20 NOTE — Telephone Encounter (Signed)
Pt c/o medication issue:  1. Name of Medication: Rosuvastatin 5 mg   2. How are you currently taking this medication (dosage and times per day)? Not currently taking   3. Are you having a reaction (difficulty breathing--STAT)? Acid reflux   4. What is your medication issue? Patient states she is experiencing acid reflux and would like to know if she can start this medication again. Please advise.

## 2023-04-20 NOTE — Telephone Encounter (Signed)
Error. Dr. Rennis Golden patient.

## 2023-05-16 ENCOUNTER — Telehealth: Payer: Self-pay | Admitting: Internal Medicine

## 2023-05-16 DIAGNOSIS — E782 Mixed hyperlipidemia: Secondary | ICD-10-CM

## 2023-05-16 DIAGNOSIS — E785 Hyperlipidemia, unspecified: Secondary | ICD-10-CM

## 2023-05-16 DIAGNOSIS — I10 Essential (primary) hypertension: Secondary | ICD-10-CM

## 2023-05-16 NOTE — Telephone Encounter (Signed)
Ok per Dr Rennis Golden Referral placed and advised patient

## 2023-05-16 NOTE — Telephone Encounter (Signed)
Pt is requesting a callback regarding pt wanting to be referred to a dietician she was seeing years ago to help her lose weight due for her Cholesterol due to her having complications with medications. She'd like to discuss further once called back. Please advise

## 2023-05-16 NOTE — Telephone Encounter (Signed)
Spoke with patient, she had seen Durwin Nora in the past but had to pay out of pocket  Will forward message to Dr Rennis Golden to see if he is willing to [facility]

## 2023-06-12 LAB — COMPREHENSIVE METABOLIC PANEL: EGFR: 94

## 2023-06-21 ENCOUNTER — Other Ambulatory Visit: Payer: Self-pay | Admitting: *Deleted

## 2023-06-21 ENCOUNTER — Encounter: Payer: Self-pay | Admitting: Internal Medicine

## 2023-06-21 DIAGNOSIS — E782 Mixed hyperlipidemia: Secondary | ICD-10-CM

## 2023-09-10 ENCOUNTER — Ambulatory Visit (HOSPITAL_BASED_OUTPATIENT_CLINIC_OR_DEPARTMENT_OTHER): Payer: PRIVATE HEALTH INSURANCE | Admitting: Internal Medicine

## 2023-10-05 ENCOUNTER — Other Ambulatory Visit (HOSPITAL_BASED_OUTPATIENT_CLINIC_OR_DEPARTMENT_OTHER): Payer: Self-pay | Admitting: Internal Medicine

## 2023-11-06 ENCOUNTER — Other Ambulatory Visit: Payer: Self-pay

## 2023-11-06 DIAGNOSIS — E782 Mixed hyperlipidemia: Secondary | ICD-10-CM

## 2023-11-06 DIAGNOSIS — E785 Hyperlipidemia, unspecified: Secondary | ICD-10-CM

## 2023-11-07 LAB — LIPID PANEL
Chol/HDL Ratio: 3.7 ratio (ref 0.0–4.4)
Cholesterol, Total: 180 mg/dL (ref 100–199)
HDL: 49 mg/dL (ref >39)
LDL Chol Calc (NIH): 109 mg/dL — ABNORMAL HIGH (ref 0–99)
Triglycerides: 122 mg/dL (ref 0–149)
VLDL Cholesterol Cal: 22 mg/dL (ref 5–40)

## 2023-11-13 ENCOUNTER — Encounter (HOSPITAL_BASED_OUTPATIENT_CLINIC_OR_DEPARTMENT_OTHER): Payer: Self-pay | Admitting: Internal Medicine

## 2023-11-13 ENCOUNTER — Ambulatory Visit (HOSPITAL_BASED_OUTPATIENT_CLINIC_OR_DEPARTMENT_OTHER): Payer: PRIVATE HEALTH INSURANCE | Admitting: Internal Medicine

## 2023-11-13 VITALS — BP 106/66 | HR 59 | Ht 63.0 in | Wt 209.0 lb

## 2023-11-13 DIAGNOSIS — E785 Hyperlipidemia, unspecified: Secondary | ICD-10-CM

## 2023-11-13 NOTE — Progress Notes (Signed)
 LIPID CLINIC CONSULT NOTE  Chief Complaint:  Manage dyslipidemia  Primary Care Physician: Victorio Grave, MD  Primary Cardiologist:  Lauro Portal, MD  HPI:  Adrienne Freeman is a 58 y.o. female who is being seen today for the evaluation of dyslipidemia at the request of Victorio Grave, MD. this is a pleasant 58 year old female Referred for evaluation management of dyslipidemia.  She is previously seen Dr. Katheryne Pane for workup of bradycardia and palpitations.  She has been noted to have dyslipidemia in the past.  Unfortunately she has been hesitant to take statin medications.  Most recently her total cholesterol was 347 with triglycerides of 355.  LDL has been in the 150 range.  She is on Lovaza  1 g twice daily.  The good news is she had a negative calcium  score in 2021 which is 0.  There is, however heart disease in her family including her father.  04/06/2023  Adrienne Freeman is seen today in follow-up.  She has numerous concerns today.  She had a lot of questions about different issues including her psychiatric medications, palpitations, cholesterol, possible side effects of her ezetimibe , etc.  She has been having some reflux symptoms which could be related to that.  I encouraged her to monitor that we may need to consider stopping it.  She has had some improvement in her triglycerides which are now near normal at 156 however her calculated LDL is slightly higher at 161.  Total cholesterol is come down to 238.  11/13/2023  Adrienne Freeman is seen today in follow-up.  She has had a nice improvement in her lipids on 5 mg rosuvastatin .  Total cholesterol now 180, triglycerides 122, HDL 49 and LDL 109 with a target LDL less than 100.  She seems to be tolerating low-dose rosuvastatin .  PMHx:  Past Medical History:  Diagnosis Date   Anemia    Anemia    Anxiety    Cancer (HCC) 01/2006   Left lower extremity malignant melanoma.   Depression    Fibroids    Uterine   GERD (gastroesophageal reflux  disease)    no meds - diet controlled   Hypercholesterolemia    Hypertension    Hypothyroidism    OCD (obsessive compulsive disorder)    Sleep apnea    does not use CPAP every night   SVD (spontaneous vaginal delivery)    x 3   Thyroid  disorder    Chronic lymphocytic thyroiditis.   Uterine polyp     Past Surgical History:  Procedure Laterality Date   ABLATION     DIAGNOSTIC LAPAROSCOPY     fibroids removed   DILATION AND CURETTAGE OF UTERUS     DOPPLER ECHOCARDIOGRAPHY  01/30/11   LVEF>70%. Mild concentric LVH. Normal LA size. mild MR. Trace TR. Normal RVSP.   HYSTEROSCOPY WITH D & C N/A 08/08/2013   Procedure: DILATATION AND CURETTAGE /HYSTEROSCOPY;  Surgeon: Dyanna Glasgow, DO;  Location: [facility];  Service: Gynecology;  Laterality: N/A;   MELANOMA EXCISION  2007   T1a melanoma from calf   WISDOM TOOTH EXTRACTION      FAMHx:  Family History  Problem Relation Age of Onset   Hypertension Mother    OCD Mother    Heart disease Father    Hypertension Father    Colon polyps Father    Alcohol abuse Sister    OCD Maternal Aunt    Pancreatic cancer Other        grandmother??    SOCHx:  reports that she quit smoking about 39 years ago. Her smoking use included cigarettes. She started smoking about 41 years ago. She has a 0.5 pack-year smoking history. She has never used smokeless tobacco. She reports current alcohol use. She reports that she does not use drugs.  ALLERGIES:  Allergies  Allergen Reactions   Buspar [Buspirone] Shortness Of Breath   Escitalopram Oxalate Shortness Of Breath   Keflex [Cephalexin] Anaphylaxis    headache   Mepergan [Meperidine-Promethazine] Other (See Comments)    Causes  Chest  Pain.   Remeron  [Mirtazapine ] Anaphylaxis   Risperdal [Risperidone] Anaphylaxis   Toprol Xl [Metoprolol Succinate] Shortness Of Breath   Altace [Ramipril]    Anafranil [Clomipramine Hcl]     Jerking body movements   Benicar Hct [Olmesartan Medoxomil-Hctz]     Memory  difficulties    Bupropion Other (See Comments)    Reaction unknown   Codeine Other (See Comments)    Patient states that doctor told her that she was allergic to this medication.   Cymbalta [Duloxetine Hcl]     Worsening depression    Effexor [Venlafaxine Hydrochloride] Other (See Comments)    Patient states that doctor told her that she was allergic to this medication.   Nexium [Esomeprazole Magnesium]     Sensation of throat closing    Octacosanol Other (See Comments)    Reaction unknown   Paroxetine Hcl Other (See Comments)    Patient states that doctor told her that she was allergic to this medication.   Protonix [Pantoprazole Sodium]    Prozac [Fluoxetine Hcl]     Anger/anxiety   Ramipril Other (See Comments)    Patient states that doctor told her that she was allergic to this medication.   Sertraline Hcl Other (See Comments)    Patient states that this medication makes her OCD worse.   Toprol Xl [Metoprolol Tartrate]     Can't recall    Prednisone Anxiety    ROS: Pertinent items noted in HPI and remainder of comprehensive ROS otherwise negative.  HOME MEDS: Current Outpatient Medications on File Prior to Visit  Medication Sig Dispense Refill   acetaminophen  (TYLENOL ) 325 MG tablet Take 325 mg by mouth every 6 (six) hours as needed.     ARIPiprazole (ABILIFY) 2 MG tablet Take 2 mg by mouth every morning.     Ascorbic Acid (VITAMIN C PO) Take 1 tablet by mouth daily. (Patient not taking: Reported on 04/06/2023)     B Complex Vitamins (VITAMIN B COMPLEX PO) Take 1 tablet by mouth daily. (Patient not taking: Reported on 04/06/2023)     calcium  carbonate (TUMS - DOSED IN MG ELEMENTAL CALCIUM ) 500 MG chewable tablet Chew 1 tablet by mouth daily. Chewable tablet     citalopram (CELEXA) 20 MG tablet Take 10 mg by mouth daily.     ergocalciferol (VITAMIN D2) 50000 UNITS capsule Take 50,000 Units by mouth once a week.     levothyroxine (SYNTHROID, LEVOTHROID) 50 MCG tablet Take  50 mcg by mouth daily.     LORazepam  (ATIVAN ) 0.5 MG tablet Take 0.5 mg by mouth every 6 (six) hours as needed.     Multiple Vitamin (MULTIVITAMIN WITH MINERALS) TABS Take 1 tablet by mouth daily.     omega-3 acid ethyl esters (LOVAZA ) 1 g capsule Take 2 capsules (2 g total) by mouth 2 (two) times daily. 360 capsule 1   potassium chloride  (KLOR-CON ) 10 MEQ tablet Take 20 mEq by mouth daily.     rosuvastatin  (  CRESTOR ) 5 MG tablet Take 1 tablet (5 mg total) by mouth at bedtime. 90 tablet 3   telmisartan-hydrochlorothiazide (MICARDIS HCT) 80-25 MG tablet Take 1 tablet by mouth daily.     tretinoin (RETIN-A) 0.025 % cream SMARTSIG:1 Application Topical Every Evening (Patient not taking: Reported on 04/06/2023)     No current facility-administered medications on file prior to visit.    LABS/IMAGING: No results found for this or any previous visit (from the past 48 hours). No results found.  LIPID PANEL:    Component Value Date/Time   CHOL 180 11/06/2023 0828   TRIG 122 11/06/2023 0828   HDL 49 11/06/2023 0828   CHOLHDL 3.7 11/06/2023 0828   LDLCALC 109 (H) 11/06/2023 0828    WEIGHTS: Wt Readings from Last 3 Encounters:  11/13/23 209 lb (94.8 kg)  04/06/23 188 lb (85.3 kg)  09/15/22 192 lb (87.1 kg)    VITALS: BP 106/66   Pulse (!) 59   Ht 5\' 3"  (1.6 m)   Wt 209 lb (94.8 kg)   SpO2 99%   BMI 37.02 kg/m   EXAM: Deferred  EKG: Deferred  ASSESSMENT: Mixed dyslipidemia, goal LDL less than 100 High triglycerides Family history of heart disease 0 CAC score (2021)  PLAN: 1.   Adrienne Freeman is close to target LDL less than 100 on 5 mg rosuvastatin  which she seems to tolerate.  I think she will be able to continue on this medication and because it could be easily prescribed by her PCP I would advise follow-up with her.  Happy to see her back on an as-needed basis.  Thanks for allowing me to participate in her care.  Hazle Lites, MD, [facility], FNLA, FACP  [place]  [facility]  [facility]  Medical Director of the Advanced Lipid Disorders &  Cardiovascular Risk Reduction Clinic Diplomate of the American Board of Clinical Lipidology Attending Cardiologist  Direct Dial: [phone]  Fax: [phone]  Website:  [email or web address]   Aviva Lemmings Camber Ninh 11/13/2023, 1:33 PM

## 2023-11-13 NOTE — Patient Instructions (Signed)
 Medication Instructions:  Your physician recommends that you continue on your current medications as directed. Please refer to the Current Medication list given to you today.  *If you need a refill on your cardiac medications before your next appointment, please call your pharmacy*  Follow-Up: At [facility], you and your health needs are our priority.  As part of our continuing mission to provide you with exceptional heart care, our providers are all part of one team.  This team includes your primary Cardiologist (physician) and Advanced Practice Providers or APPs (Physician Assistants and Nurse Practitioners) who all work together to provide you with the care you need, when you need it.  Your next appointment:   As needed  Provider:   Dr. Maximo Spar
